# Patient Record
Sex: Male | Born: 1965 | Race: Black or African American | Hispanic: No | Marital: Married | Smoking: Never smoker
Health system: Southern US, Community
[De-identification: ages and names within clinical notes are randomized; demographics above are authoritative.]

## PROBLEM LIST (undated history)

## (undated) HISTORY — PX: VASECTOMY: SHX75

## (undated) HISTORY — PX: ORCHIECTOMY: SHX2116

---

## 2013-05-13 ENCOUNTER — Ambulatory Visit (INDEPENDENT_AMBULATORY_CARE_PROVIDER_SITE_OTHER): Payer: BC Managed Care – PPO

## 2013-05-13 ENCOUNTER — Other Ambulatory Visit: Payer: Self-pay | Admitting: Sports Medicine

## 2013-05-13 ENCOUNTER — Telehealth: Payer: Self-pay

## 2013-05-13 ENCOUNTER — Encounter: Payer: Self-pay | Admitting: Sports Medicine

## 2013-05-13 ENCOUNTER — Ambulatory Visit (INDEPENDENT_AMBULATORY_CARE_PROVIDER_SITE_OTHER): Payer: BC Managed Care – PPO | Admitting: Sports Medicine

## 2013-05-13 VITALS — BP 143/90 | HR 78 | Ht 71.0 in | Wt 259.0 lb

## 2013-05-13 DIAGNOSIS — N50812 Left testicular pain: Secondary | ICD-10-CM

## 2013-05-13 DIAGNOSIS — Z299 Encounter for prophylactic measures, unspecified: Secondary | ICD-10-CM | POA: Insufficient documentation

## 2013-05-13 DIAGNOSIS — Z9889 Other specified postprocedural states: Secondary | ICD-10-CM

## 2013-05-13 DIAGNOSIS — N509 Disorder of male genital organs, unspecified: Secondary | ICD-10-CM

## 2013-05-13 DIAGNOSIS — N433 Hydrocele, unspecified: Secondary | ICD-10-CM | POA: Insufficient documentation

## 2013-05-13 DIAGNOSIS — E669 Obesity, unspecified: Secondary | ICD-10-CM

## 2013-05-13 MED ORDER — NAPROXEN 500 MG PO TABS: 500.0000 mg | ORAL_TABLET | Freq: Two times a day (BID) | ORAL | Status: DC

## 2013-05-13 NOTE — Assessment & Plan Note (Signed)
Consider weight loss medication in the future.

## 2013-05-13 NOTE — Assessment & Plan Note (Signed)
We can discuss further at future visits.

## 2013-05-13 NOTE — Telephone Encounter (Signed)
Patient called wanted to know if he could get a pain medication sent to his pharmacy. Rhonda Cunningham,CMA

## 2013-05-13 NOTE — Assessment & Plan Note (Addendum)
With a history of a right testicular torsion with orchiectomy. Now, for 2 weeks he has had indolent pain in his remaining testicle. STAT ultrasound, UA, Urine GC/Chlam, Urine culture. If all is negative, we can workup further causes of his pain, if positive, he would need stat urologic consultation.  Testicular ultrasound is negative for torsion, there is a hydrocele which is likely causing his pain, which he does describe as worse with Valsalva. Referral to urology.

## 2013-05-13 NOTE — Addendum Note (Signed)
Addended by: Monica BectonHEKKEKANDAM, Forest Redwine J on: 05/13/2013 01:24 PM   Modules accepted: Orders

## 2013-05-13 NOTE — Progress Notes (Addendum)
  Subjective:    CC: Establish care.   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 His son is a 6 foot 3 inch 215 pounds tight end who seems to be an NFL prospect. Unfortunately in the past he had any episode of right testicular pain that resulted from a torsion, and subsequently led to orchiectomy. More recently, 2 weeks ago he developed pain in the left testicle, it feels similar to before. He denies any discharge, or dysuria. Pain is moderate and worsening. It does get worse with straining.  In the future he would also like to discuss health maintenance loss medication.  Past medical history, Surgical history, Family history not pertinant except as noted below, Social history, Allergies, and medications have been entered into the medical record, reviewed, and no changes needed.   Review of Systems: No headache, visual changes, nausea, vomiting, diarrhea, constipation, dizziness, abdominal pain, skin rash, fevers, chills, night sweats, swollen lymph nodes, weight loss, chest pain, body aches, joint swelling, muscle aches, shortness of breath, mood changes, visual or auditory hallucinations.  Objective:    General: Well Developed, well nourished, and in no acute distress.  Neuro: Alert and oriented x3, extra-ocular muscles intact, sensation grossly intact.  HEENT: Normocephalic, atraumatic, pupils equal round reactive to light, neck supple, no masses, no lymphadenopathy, thyroid nonpalpable.  Skin: Warm and dry, no rashes noted.  Cardiac: Regular rate and rhythm, no murmurs rubs or gallops.  Respiratory: Clear to auscultation bilaterally. Not using accessory muscles, speaking in full sentences.  Abdominal: Soft, nontender, nondistended, positive bowel sounds, no masses, no organomegaly.  Musculoskeletal: Shoulder, elbow, wrist, hip, knee, ankle stable, and with full range of motion. Genitourinary: The left testicle is somewhat high riding,  and feels to be horizontally position. It is extremely tender to palpation, particularly over the epididymis. No penile discharge, no lymphadenopathy, no hernias.  Impression and Recommendations:    The patient was counselled, risk factors were discussed, anticipatory guidance given.

## 2013-05-13 NOTE — Telephone Encounter (Signed)
Not a problem, calling in prescription strength naproxen, if insufficient, I can add tramadol.

## 2013-05-14 LAB — GC/CHLAMYDIA PROBE AMP, URINE
Chlamydia, Swab/Urine, PCR: NEGATIVE
GC Probe Amp, Urine: NEGATIVE

## 2013-05-14 LAB — URINALYSIS
Bilirubin Urine: NEGATIVE
Glucose, UA: NEGATIVE mg/dL
Hgb urine dipstick: NEGATIVE
Ketones, ur: NEGATIVE mg/dL
Leukocytes, UA: NEGATIVE
Nitrite: NEGATIVE
Protein, ur: NEGATIVE mg/dL
Specific Gravity, Urine: 1.023 (ref 1.005–1.030)
Urobilinogen, UA: 0.2 mg/dL (ref 0.0–1.0)
pH: 5.5 (ref 5.0–8.0)

## 2013-05-14 LAB — URINE CULTURE
Colony Count: NO GROWTH
Organism ID, Bacteria: NO GROWTH

## 2013-05-14 NOTE — Telephone Encounter (Signed)
Called patient left a detailed message on patient mvm advising that Naproxen was sent to pharmacy. Natausha Jungwirth,CMA

## 2013-05-15 ENCOUNTER — Telehealth: Payer: Self-pay

## 2013-05-15 DIAGNOSIS — Z299 Encounter for prophylactic measures, unspecified: Secondary | ICD-10-CM

## 2013-05-15 NOTE — Telephone Encounter (Signed)
Orders in 

## 2013-05-15 NOTE — Telephone Encounter (Signed)
Patient walked in request order for routine blood work. Patient will stop by tomorrow to have lab done. Tequlia Gonsalves,CMA

## 2013-05-17 ENCOUNTER — Telehealth: Payer: Self-pay | Admitting: Sports Medicine

## 2013-05-17 NOTE — Telephone Encounter (Signed)
Mr Paulla ForeKellum came by this morning. He was under the impression that he was being referred to [facility] for a Hernia but he was being referred for pain in left testicle, etc. He was given a copy of initial referral to call for an appt for the diagnoses noted.

## 2013-05-17 NOTE — Telephone Encounter (Signed)
Its a hydrocele which is causing his testicular pain, this is similar to a hernia. So yes he does need a urologist. Did we not set up the referral?

## 2013-05-17 NOTE — Telephone Encounter (Signed)
Dr T in answer to your question, referral was faxed  to [facility] on 5/11 but patient was told by a representative at  their office they  do not see Hernia patients. I called this  office today and left message for referral coordinator to get back with me on this.

## 2013-05-18 LAB — PSA, TOTAL AND FREE
PSA, Free Pct: 33 % (ref >25)
PSA, Free: 0.33 ng/mL
PSA: 1 ng/mL (ref <=4.00)

## 2013-05-18 LAB — CBC
HCT: 44.2 % (ref 39.0–52.0)
Hemoglobin: 15 g/dL (ref 13.0–17.0)
MCH: 29.8 pg (ref 26.0–34.0)
MCHC: 33.9 g/dL (ref 30.0–36.0)
MCV: 87.7 fL (ref 78.0–100.0)
Platelets: 217 10*3/uL (ref 150–400)
RBC: 5.04 MIL/uL (ref 4.22–5.81)
RDW: 13.9 % (ref 11.5–15.5)
WBC: 5.1 10*3/uL (ref 4.0–10.5)

## 2013-05-18 LAB — COMPREHENSIVE METABOLIC PANEL
ALT: 23 U/L (ref 0–53)
AST: 17 U/L (ref 0–37)
Albumin: 4.4 g/dL (ref 3.5–5.2)
BUN: 13 mg/dL (ref 6–23)
CO2: 24 mEq/L (ref 19–32)
Chloride: 104 mEq/L (ref 96–112)
Creat: 1.04 mg/dL (ref 0.50–1.35)
Sodium: 138 mEq/L (ref 135–145)
Total Bilirubin: 0.6 mg/dL (ref 0.2–1.2)
Total Protein: 7.2 g/dL (ref 6.0–8.3)

## 2013-05-18 LAB — COMPREHENSIVE METABOLIC PANEL WITH GFR
Alkaline Phosphatase: 66 U/L (ref 39–117)
Calcium: 9.5 mg/dL (ref 8.4–10.5)
Glucose, Bld: 96 mg/dL (ref 70–99)
Potassium: 4.3 meq/L (ref 3.5–5.3)

## 2013-05-18 LAB — LIPID PANEL
Cholesterol: 228 mg/dL — ABNORMAL HIGH (ref 0–200)
HDL: 36 mg/dL — ABNORMAL LOW (ref >39)
LDL Cholesterol: 162 mg/dL — ABNORMAL HIGH (ref 0–99)
Total CHOL/HDL Ratio: 6.3 Ratio
Triglycerides: 149 mg/dL (ref <150)
VLDL: 30 mg/dL (ref 0–40)

## 2013-05-18 LAB — HEMOGLOBIN A1C
Hgb A1c MFr Bld: 6.1 % — ABNORMAL HIGH (ref <5.7)
Mean Plasma Glucose: 128 mg/dL — ABNORMAL HIGH (ref <117)

## 2013-05-18 LAB — TSH: TSH: 1.194 u[IU]/mL (ref 0.350–4.500)

## 2013-05-20 ENCOUNTER — Encounter: Payer: Self-pay | Admitting: Sports Medicine

## 2013-05-20 ENCOUNTER — Ambulatory Visit (INDEPENDENT_AMBULATORY_CARE_PROVIDER_SITE_OTHER): Payer: BC Managed Care – PPO | Admitting: Sports Medicine

## 2013-05-20 VITALS — BP 138/80 | HR 91 | Ht 71.0 in | Wt 263.0 lb

## 2013-05-20 DIAGNOSIS — N433 Hydrocele, unspecified: Secondary | ICD-10-CM

## 2013-05-20 DIAGNOSIS — R7309 Other abnormal glucose: Secondary | ICD-10-CM

## 2013-05-20 DIAGNOSIS — E119 Type 2 diabetes mellitus without complications: Secondary | ICD-10-CM | POA: Insufficient documentation

## 2013-05-20 DIAGNOSIS — E785 Hyperlipidemia, unspecified: Secondary | ICD-10-CM

## 2013-05-20 DIAGNOSIS — E669 Obesity, unspecified: Secondary | ICD-10-CM

## 2013-05-20 DIAGNOSIS — R7303 Prediabetes: Secondary | ICD-10-CM

## 2013-05-20 LAB — TESTOSTERONE, FREE, TOTAL, SHBG
Sex Hormone Binding: 35 nmol/L (ref 13–71)
Testosterone, Free: 73.8 pg/mL (ref 47.0–244.0)
Testosterone-% Free: 1.9 % (ref 1.6–2.9)
Testosterone: 379 ng/dL (ref 300–890)

## 2013-05-20 MED ORDER — PHENTERMINE HCL 37.5 MG PO CAPS: 37.5000 mg | ORAL_CAPSULE | ORAL | Status: DC

## 2013-05-20 MED ORDER — ATORVASTATIN CALCIUM 40 MG PO TABS: 40.0000 mg | ORAL_TABLET | Freq: Every day | ORAL | Status: DC

## 2013-05-20 NOTE — Assessment & Plan Note (Signed)
Starting phentermine. Return monthly for weight checks and refills. 

## 2013-05-20 NOTE — Assessment & Plan Note (Signed)
Has not yet seen the urologist.

## 2013-05-20 NOTE — Progress Notes (Signed)
  Subjective:    CC: Followup and labs  HPI: Hyperlipidemia: Amenable to trying Lipitor.  Obesity: Desires to weight loss medication.  Hydrocele: Has not yet seen the urologist.  Past medical history, Surgical history, Family history not pertinant except as noted below, Social history, Allergies, and medications have been entered into the medical record, reviewed, and no changes needed.   Review of Systems: No fevers, chills, night sweats, weight loss, chest pain, or shortness of breath.   Objective:    General: Well Developed, well nourished, and in no acute distress.  Neuro: Alert and oriented x3, extra-ocular muscles intact, sensation grossly intact.  HEENT: Normocephalic, atraumatic, pupils equal round reactive to light, neck supple, no masses, no lymphadenopathy, thyroid nonpalpable.  Skin: Warm and dry, no rashes. Cardiac: Regular rate and rhythm, no murmurs rubs or gallops, no lower extremity edema.  Respiratory: Clear to auscultation bilaterally. Not using accessory muscles, speaking in full sentences.  Impression and Recommendations:

## 2013-05-20 NOTE — Assessment & Plan Note (Signed)
Starting Lipitor. Recheck in 3 months.

## 2013-05-20 NOTE — Assessment & Plan Note (Signed)
I think that weight loss combined with improved exercise and dieting will help decrease this towards the normal range. He recheck this in 3 months.

## 2013-06-17 ENCOUNTER — Encounter: Payer: Self-pay | Admitting: Sports Medicine

## 2013-06-17 ENCOUNTER — Ambulatory Visit (INDEPENDENT_AMBULATORY_CARE_PROVIDER_SITE_OTHER): Payer: BC Managed Care – PPO | Admitting: Sports Medicine

## 2013-06-17 VITALS — BP 133/86 | HR 106 | Ht 71.0 in | Wt 253.0 lb

## 2013-06-17 DIAGNOSIS — E669 Obesity, unspecified: Secondary | ICD-10-CM

## 2013-06-17 MED ORDER — PHENTERMINE HCL 37.5 MG PO CAPS: 37.5000 mg | ORAL_CAPSULE | ORAL | Status: DC

## 2013-06-17 MED ORDER — PHENTERMINE HCL 37.5 MG PO TABS: ORAL_TABLET | ORAL | Status: DC

## 2013-06-17 MED ORDER — TAMSULOSIN HCL 0.4 MG PO CAPS: 0.4000 mg | ORAL_CAPSULE | Freq: Every day | ORAL | Status: DC

## 2013-06-17 NOTE — Progress Notes (Signed)
  Subjective:    CC: Follow up  HPI: Obesity: 10 pound weight loss since starting phentermine, unfortunately having some obstructive uropathy type symptoms and a few palpitations. No chest pain, shortness of breath, presyncope.  Past medical history, Surgical history, Family history not pertinant except as noted below, Social history, Allergies, and medications have been entered into the medical record, reviewed, and no changes needed.   Review of Systems: No fevers, chills, night sweats, weight loss, chest pain, or shortness of breath.   Objective:    General: Well Developed, well nourished, and in no acute distress.  Neuro: Alert and oriented x3, extra-ocular muscles intact, sensation grossly intact.  HEENT: Normocephalic, atraumatic, pupils equal round reactive to light, neck supple, no masses, no lymphadenopathy, thyroid nonpalpable.  Skin: Warm and dry, no rashes. Cardiac: Regular rate and rhythm, no murmurs rubs or gallops, no lower extremity edema.  Respiratory: Clear to auscultation bilaterally. Not using accessory muscles, speaking in full sentences.   Impression and Recommendations:

## 2013-06-17 NOTE — Assessment & Plan Note (Addendum)
Excellent response with a 10 pound weight loss in the first month. Refilling medication. He does seem to be getting some obstructive uropathy, we discussed breaking to half a tab twice a day and adding an additional Flomax. At this point we will add Flomax as this may be somewhat helpful for his blood pressure as well. Return in a month.

## 2013-07-15 ENCOUNTER — Ambulatory Visit (INDEPENDENT_AMBULATORY_CARE_PROVIDER_SITE_OTHER): Payer: BC Managed Care – PPO | Admitting: Sports Medicine

## 2013-07-15 ENCOUNTER — Encounter: Payer: Self-pay | Admitting: Sports Medicine

## 2013-07-15 VITALS — BP 122/81 | HR 116 | Ht 71.0 in | Wt 251.0 lb

## 2013-07-15 DIAGNOSIS — J45909 Unspecified asthma, uncomplicated: Secondary | ICD-10-CM

## 2013-07-15 DIAGNOSIS — J4520 Mild intermittent asthma, uncomplicated: Secondary | ICD-10-CM

## 2013-07-15 DIAGNOSIS — E669 Obesity, unspecified: Secondary | ICD-10-CM

## 2013-07-15 DIAGNOSIS — N139 Obstructive and reflux uropathy, unspecified: Secondary | ICD-10-CM

## 2013-07-15 MED ORDER — PHENTERMINE HCL 37.5 MG PO CAPS: 37.5000 mg | ORAL_CAPSULE | ORAL | Status: DC

## 2013-07-15 MED ORDER — ALBUTEROL SULFATE HFA 108 (90 BASE) MCG/ACT IN AERS: 2.0000 | INHALATION_SPRAY | Freq: Four times a day (QID) | RESPIRATORY_TRACT | Status: AC | PRN

## 2013-07-15 NOTE — Assessment & Plan Note (Signed)
Well controlled with Flomax 

## 2013-07-15 NOTE — Assessment & Plan Note (Signed)
Continue antihistamines, refilling albuterol.

## 2013-07-15 NOTE — Assessment & Plan Note (Signed)
Refilling phentermine. Return in 1 month.

## 2013-07-15 NOTE — Progress Notes (Signed)
  Subjective:    CC: Followup  HPI: Obesity: Has lost a little bit more weight.  Obstructive uropathy : completely resolved with Flomax.  Allergic asthma: Needs refill on albuterol.  Past medical history, Surgical history, Family history not pertinant except as noted below, Social history, Allergies, and medications have been entered into the medical record, reviewed, and no changes needed.   Review of Systems: No fevers, chills, night sweats, weight loss, chest pain, or shortness of breath.   Objective:    General: Well Developed, well nourished, and in no acute distress.  Neuro: Alert and oriented x3, extra-ocular muscles intact, sensation grossly intact.  HEENT: Normocephalic, atraumatic, pupils equal round reactive to light, neck supple, no masses, no lymphadenopathy, thyroid nonpalpable.  Skin: Warm and dry, no rashes. Cardiac: Regular rate and rhythm, no murmurs rubs or gallops, no lower extremity edema.  Respiratory: Clear to auscultation bilaterally. Not using accessory muscles, speaking in full sentences.  Impression and Recommendations:

## 2013-07-21 ENCOUNTER — Encounter: Payer: Self-pay | Admitting: Sports Medicine

## 2013-07-21 DIAGNOSIS — E669 Obesity, unspecified: Secondary | ICD-10-CM

## 2013-07-22 MED ORDER — VALACYCLOVIR HCL 1 G PO TABS: 1000.0000 mg | ORAL_TABLET | Freq: Two times a day (BID) | ORAL | Status: DC

## 2013-07-25 MED ORDER — VALACYCLOVIR HCL 1 G PO TABS: 1000.0000 mg | ORAL_TABLET | Freq: Two times a day (BID) | ORAL | Status: AC

## 2013-07-25 MED ORDER — PHENTERMINE HCL 37.5 MG PO CAPS: 37.5000 mg | ORAL_CAPSULE | ORAL | Status: DC

## 2013-07-25 NOTE — Addendum Note (Signed)
Addended by: Monica BectonHEKKEKANDAM, Teretha Chalupa J on: 07/25/2013 05:04 PM   Modules accepted: Orders

## 2013-08-06 ENCOUNTER — Encounter: Payer: Self-pay | Admitting: Sports Medicine

## 2013-08-06 MED ORDER — TADALAFIL 5 MG PO TABS: 5.0000 mg | ORAL_TABLET | Freq: Every day | ORAL | Status: DC | PRN

## 2013-08-12 ENCOUNTER — Ambulatory Visit: Payer: BC Managed Care – PPO | Admitting: Sports Medicine

## 2013-08-13 ENCOUNTER — Encounter: Payer: Self-pay | Admitting: Sports Medicine

## 2013-08-13 ENCOUNTER — Ambulatory Visit (INDEPENDENT_AMBULATORY_CARE_PROVIDER_SITE_OTHER): Payer: BC Managed Care – PPO | Admitting: Sports Medicine

## 2013-08-13 VITALS — BP 136/84 | HR 89 | Ht 71.0 in | Wt 249.0 lb

## 2013-08-13 DIAGNOSIS — N139 Obstructive and reflux uropathy, unspecified: Secondary | ICD-10-CM | POA: Diagnosis not present

## 2013-08-13 DIAGNOSIS — B360 Pityriasis versicolor: Secondary | ICD-10-CM | POA: Diagnosis not present

## 2013-08-13 DIAGNOSIS — E785 Hyperlipidemia, unspecified: Secondary | ICD-10-CM

## 2013-08-13 DIAGNOSIS — Z23 Encounter for immunization: Secondary | ICD-10-CM

## 2013-08-13 DIAGNOSIS — R7303 Prediabetes: Secondary | ICD-10-CM

## 2013-08-13 DIAGNOSIS — R7309 Other abnormal glucose: Secondary | ICD-10-CM

## 2013-08-13 DIAGNOSIS — E669 Obesity, unspecified: Secondary | ICD-10-CM

## 2013-08-13 MED ORDER — FLUCONAZOLE 150 MG PO TABS: ORAL_TABLET | ORAL | Status: DC

## 2013-08-13 MED ORDER — PHENTERMINE HCL 37.5 MG PO CAPS: 37.5000 mg | ORAL_CAPSULE | ORAL | Status: DC

## 2013-08-13 NOTE — Assessment & Plan Note (Signed)
Continue Lipitor, rechecking lipids per patient request.

## 2013-08-13 NOTE — Assessment & Plan Note (Signed)
Symptoms resolve with Flomax.

## 2013-08-13 NOTE — Progress Notes (Signed)
  Subjective:    CC: Recheck weight  HPI: Richard Rios is a pleasant 48 year old male, he has lost another couple of pounds.  Erectile dysfunction: Has not yet gotten the Cialis.  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  Hyperlipidemia: Stable on Lipitor.  Past medical history, Surgical history, Family history not pertinant except as noted below, Social history, Allergies, and medications have been entered into the medical record, reviewed, and no changes needed.   Review of Systems: No fevers, chills, night sweats, weight loss, chest pain, or shortness of breath.   Objective:    General: Well Developed, well nourished, and in no acute distress.  Neuro: Alert and oriented x3, extra-ocular muscles intact, sensation grossly intact.  HEENT: Normocephalic, atraumatic, pupils equal round reactive to light, neck supple, no masses, no lymphadenopathy, thyroid nonpalpable.  Skin: Warm and dry, there is a splotchy hypopigmented rash on his arms and back nonpruritic. Cardiac: Regular rate and rhythm, no murmurs rubs or gallops, no lower extremity edema.  Respiratory: Clear to auscultation bilaterally. Not using accessory muscles, speaking in full sentences.  Impression and Recommendations:

## 2013-08-13 NOTE — Assessment & Plan Note (Signed)
Fluconazole 300 mg Q. weekly x2

## 2013-08-13 NOTE — Assessment & Plan Note (Signed)
Continued weight loss. Refilling phentermine.

## 2013-08-13 NOTE — Assessment & Plan Note (Signed)
Check hemoglobin A1c.

## 2013-09-13 LAB — LIPID PANEL
Cholesterol: 215 mg/dL — ABNORMAL HIGH (ref 0–200)
HDL: 39 mg/dL — ABNORMAL LOW (ref >39)
LDL Cholesterol: 155 mg/dL — ABNORMAL HIGH (ref 0–99)
Total CHOL/HDL Ratio: 5.5 Ratio
Triglycerides: 103 mg/dL (ref <150)
VLDL: 21 mg/dL (ref 0–40)

## 2013-09-13 LAB — COMPREHENSIVE METABOLIC PANEL
AST: 16 U/L (ref 0–37)
BUN: 18 mg/dL (ref 6–23)
CO2: 27 mEq/L (ref 19–32)
Calcium: 9.8 mg/dL (ref 8.4–10.5)
Chloride: 99 mEq/L (ref 96–112)
Creat: 1.13 mg/dL (ref 0.50–1.35)
Potassium: 4.5 mEq/L (ref 3.5–5.3)
Sodium: 137 mEq/L (ref 135–145)
Total Protein: 7.4 g/dL (ref 6.0–8.3)

## 2013-09-13 LAB — COMPREHENSIVE METABOLIC PANEL WITH GFR
ALT: 24 U/L (ref 0–53)
Albumin: 4.5 g/dL (ref 3.5–5.2)
Alkaline Phosphatase: 80 U/L (ref 39–117)
Glucose, Bld: 97 mg/dL (ref 70–99)
Total Bilirubin: 0.9 mg/dL (ref 0.2–1.2)

## 2013-09-13 LAB — HEMOGLOBIN A1C
Hgb A1c MFr Bld: 6.5 % — ABNORMAL HIGH (ref <5.7)
Mean Plasma Glucose: 140 mg/dL — ABNORMAL HIGH (ref <117)

## 2013-09-18 ENCOUNTER — Encounter: Payer: Self-pay | Admitting: Sports Medicine

## 2013-09-18 ENCOUNTER — Ambulatory Visit (INDEPENDENT_AMBULATORY_CARE_PROVIDER_SITE_OTHER): Payer: BC Managed Care – PPO | Admitting: Sports Medicine

## 2013-09-18 VITALS — BP 139/83 | HR 94 | Ht 71.0 in | Wt 244.0 lb

## 2013-09-18 DIAGNOSIS — E669 Obesity, unspecified: Secondary | ICD-10-CM | POA: Diagnosis not present

## 2013-09-18 DIAGNOSIS — B360 Pityriasis versicolor: Secondary | ICD-10-CM | POA: Diagnosis not present

## 2013-09-18 DIAGNOSIS — Z23 Encounter for immunization: Secondary | ICD-10-CM

## 2013-09-18 MED ORDER — PHENTERMINE HCL 37.5 MG PO CAPS: ORAL_CAPSULE | ORAL | Status: DC

## 2013-09-18 NOTE — Assessment & Plan Note (Signed)
Has not yet started treatment for tinea versicolor. We can recheck this in one month when he comes back for his weight check.

## 2013-09-18 NOTE — Progress Notes (Signed)
  Subjective:    CC: Wt check  HPI: Obesity: Excellent continued weight loss, 20 pounds so far in 4 months.  Tinea versicolor: Has not yet started Diflucan.  Past medical history, Surgical history, Family history not pertinant except as noted below, Social history, Allergies, and medications have been entered into the medical record, reviewed, and no changes needed.   Review of Systems: No fevers, chills, night sweats, weight loss, chest pain, or shortness of breath.   Objective:    General: Well Developed, well nourished, and in no acute distress.  Neuro: Alert and oriented x3, extra-ocular muscles intact, sensation grossly intact.  HEENT: Normocephalic, atraumatic, pupils equal round reactive to light, neck supple, no masses, no lymphadenopathy, thyroid nonpalpable.  Skin: Warm and dry, there is a splotchy hypopigmented rash on his arms and back nonpruritic Cardiac: Regular rate and rhythm, no murmurs rubs or gallops, no lower extremity edema.  Respiratory: Clear to auscultation bilaterally. Not using accessory muscles, speaking in full sentences.  Impression and Recommendations:

## 2013-09-18 NOTE — Assessment & Plan Note (Signed)
Continue phentermine. A1c will likely come down as he continues to lose weight. He has now lost 20 pounds since May of this year.

## 2013-09-21 ENCOUNTER — Other Ambulatory Visit: Payer: Self-pay | Admitting: Sports Medicine

## 2013-09-23 MED ORDER — TADALAFIL 5 MG PO TABS: 5.0000 mg | ORAL_TABLET | Freq: Every day | ORAL | Status: DC | PRN

## 2013-10-11 ENCOUNTER — Telehealth: Payer: Self-pay | Admitting: *Deleted

## 2013-10-11 NOTE — Telephone Encounter (Signed)
Pharmacy was called that his insurance denied cialis and they are going to contract the patient. Corliss SkainsJamie Camari Wisham, CMA

## 2013-10-18 ENCOUNTER — Encounter: Payer: Self-pay | Admitting: Sports Medicine

## 2013-10-18 ENCOUNTER — Ambulatory Visit (INDEPENDENT_AMBULATORY_CARE_PROVIDER_SITE_OTHER): Payer: BC Managed Care – PPO | Admitting: Sports Medicine

## 2013-10-18 VITALS — BP 127/73 | HR 80 | Ht 71.0 in | Wt 243.0 lb

## 2013-10-18 DIAGNOSIS — B360 Pityriasis versicolor: Secondary | ICD-10-CM | POA: Diagnosis not present

## 2013-10-18 DIAGNOSIS — N139 Obstructive and reflux uropathy, unspecified: Secondary | ICD-10-CM

## 2013-10-18 DIAGNOSIS — E669 Obesity, unspecified: Secondary | ICD-10-CM | POA: Diagnosis not present

## 2013-10-18 MED ORDER — PHENTERMINE HCL 37.5 MG PO CAPS: ORAL_CAPSULE | ORAL | Status: DC

## 2013-10-18 MED ORDER — FLUCONAZOLE 150 MG PO TABS: ORAL_TABLET | ORAL | Status: DC

## 2013-10-18 NOTE — Assessment & Plan Note (Signed)
Improve significantly on Diflucan. Adding an additional course.

## 2013-10-18 NOTE — Assessment & Plan Note (Signed)
Doing very well with Flomax however has started to experience some orthostasis. He is going to discontinue Flomax for now, if obstructive symptoms persist we can certainly consider RapaFlo.

## 2013-10-18 NOTE — Assessment & Plan Note (Signed)
Continues to lose weight, over 20 pounds so far since May. We will refill phentermine, for one more month, and then switch to one half dose

## 2013-10-18 NOTE — Progress Notes (Signed)
  Subjective:    CC: Followup  HPI: Obesity: Continued weight loss, although slowing.  Tinea versicolor: Improved with Diflucan, although still present.   Obstructive uropathy: Improved with Flomax, however is starting to get some orthostatic symptoms.   Past medical history, Surgical history, Family history not pertinant except as noted below, Social history, Allergies, and medications have been entered into the medical record, reviewed, and no changes needed.   Review of Systems: No fevers, chills, night sweats, weight loss, chest pain, or shortness of breath.   Objective:    General: Well Developed, well nourished, and in no acute distress.  Neuro: Alert and oriented x3, extra-ocular muscles intact, sensation grossly intact.  HEENT: Normocephalic, atraumatic, pupils equal round reactive to light, neck supple, no masses, no lymphadenopathy, thyroid nonpalpable.  Skin: Warm and dry,  persistent but less evident hyperpigmented rash over the arms and back.  Cardiac: Regular rate and rhythm, no murmurs rubs or gallops, no lower extremity edema.  Respiratory: Clear to auscultation bilaterally. Not using accessory muscles, speaking in full sentences.  Impression and Recommendations:

## 2013-10-18 NOTE — Patient Instructions (Signed)
Tamsulosin capsules   What is this medicine?   TAMSULOSIN (tam SOO loe sin) is used to treat enlargement of the prostate gland in men, a condition called benign prostatic hyperplasia or BPH. It is not for use in women. It works by relaxing muscles in the prostate and bladder neck. This improves urine flow and reduces BPH symptoms.   This medicine may be used for other purposes; ask your health care provider or pharmacist if you have questions.   COMMON BRAND NAME(S): Flomax   What should I tell my health care provider before I take this medicine?   They need to know if you have any of the following conditions:   -advanced kidney disease   -advanced liver disease   -low blood pressure   -prostate cancer   -an unusual or allergic reaction to tamsulosin, sulfa drugs, other medicines, foods, dyes, or preservatives   -pregnant or trying to get pregnant   -breast-feeding   How should I use this medicine?   Take this medicine by mouth about 30 minutes after the same meal every day. Follow the directions on the prescription label. Swallow the capsules whole with a glass of water. Do not crush, chew, or open capsules. Do not take your medicine more often than directed. Do not stop taking your medicine unless your doctor tells you to.   Talk to your pediatrician regarding the use of this medicine in children. Special care may be needed.   Overdosage: If you think you have taken too much of this medicine contact a poison control center or emergency room at once.   NOTE: This medicine is only for you. Do not share this medicine with others.   What if I miss a dose?   If you miss a dose, take it as soon as you can. If it is almost time for your next dose, take only that dose. Do not take double or extra doses. If you stop taking your medicine for several days or more, ask your doctor or health care professional what dose you should start back on.   What may interact with this medicine?   -cimetidine   -fluoxetine   -ketoconazole    -medicines for erectile disfunction like sildenafil, tadalafil, vardenafil   -medicines for high blood pressure   -other alpha-blockers like alfuzosin, doxazosin, phentolamine, phenoxybenzamine, prazosin, terazosin   -warfarin   This list may not describe all possible interactions. Give your health care provider a list of all the medicines, herbs, non-prescription drugs, or dietary supplements you use. Also tell them if you smoke, drink alcohol, or use illegal drugs. Some items may interact with your medicine.   What should I watch for while using this medicine?   Visit your doctor or health care professional for regular check ups. You will need lab work done before you start this medicine and regularly while you are taking it. Check your blood pressure as directed. Ask your health care professional what your blood pressure should be, and when you should contact him or her.   This medicine may make you feel dizzy or lightheaded. This is more likely to happen after the first dose, after an increase in dose, or during hot weather or exercise. Drinking alcohol and taking some medicines can make this worse. Do not drive, use machinery, or do anything that needs mental alertness until you know how this medicine affects you. Do not sit or stand up quickly. If you begin to feel dizzy, sit down until you   feel better. These effects can decrease once your body adjusts to the medicine.   Contact your doctor or health care professional right away if you have an erection that lasts longer than 4 hours or if it becomes painful. This may be a sign of a serious problem and must be treated right away to prevent permanent damage.   If you are thinking of having cataract surgery, tell your eye surgeon that you have taken this medicine.   What side effects may I notice from receiving this medicine?   Side effects that you should report to your doctor or health care professional as soon as possible:   -allergic reactions like skin  rash or itching, hives, swelling of the lips, mouth, tongue, or throat   -breathing problems   -change in vision   -feeling faint or lightheaded   -irregular heartbeat   -prolonged or painful erection   -weakness   Side effects that usually do not require medical attention (report to your doctor or health care professional if they continue or are bothersome):   -back pain   -change in sex drive or performance   -constipation, nausea or vomiting   -cough   -drowsy   -runny or stuffy nose   -trouble sleeping   This list may not describe all possible side effects. Call your doctor for medical advice about side effects. You may report side effects to FDA at 1-800-FDA-1088.   Where should I keep my medicine?   Keep out of the reach of children.   Store at room temperature between 15 and 30 degrees C (59 and 86 degrees F). Throw away any unused medicine after the expiration date.   NOTE: This sheet is a summary. It may not cover all possible information. If you have questions about this medicine, talk to your doctor, pharmacist, or health care provider.    2015, [employer]. (2011-12-21 14:11:34)

## 2013-11-01 ENCOUNTER — Encounter: Payer: Self-pay | Admitting: Sports Medicine

## 2013-11-04 NOTE — Telephone Encounter (Signed)
Patient needs help with getting MRI and imaging test results/reports to me.  Lets make sure I get images on a disc.

## 2013-11-15 ENCOUNTER — Ambulatory Visit (INDEPENDENT_AMBULATORY_CARE_PROVIDER_SITE_OTHER): Payer: BC Managed Care – PPO | Admitting: Sports Medicine

## 2013-11-15 ENCOUNTER — Ambulatory Visit (INDEPENDENT_AMBULATORY_CARE_PROVIDER_SITE_OTHER): Payer: BC Managed Care – PPO

## 2013-11-15 ENCOUNTER — Telehealth: Payer: Self-pay | Admitting: Sports Medicine

## 2013-11-15 ENCOUNTER — Encounter: Payer: Self-pay | Admitting: Sports Medicine

## 2013-11-15 VITALS — BP 139/89 | HR 99 | Ht 71.0 in | Wt 242.0 lb

## 2013-11-15 DIAGNOSIS — E669 Obesity, unspecified: Secondary | ICD-10-CM | POA: Diagnosis not present

## 2013-11-15 DIAGNOSIS — B360 Pityriasis versicolor: Secondary | ICD-10-CM | POA: Diagnosis not present

## 2013-11-15 DIAGNOSIS — M5412 Radiculopathy, cervical region: Secondary | ICD-10-CM | POA: Insufficient documentation

## 2013-11-15 DIAGNOSIS — M5416 Radiculopathy, lumbar region: Secondary | ICD-10-CM

## 2013-11-15 DIAGNOSIS — M545 Low back pain: Secondary | ICD-10-CM

## 2013-11-15 DIAGNOSIS — M4802 Spinal stenosis, cervical region: Secondary | ICD-10-CM

## 2013-11-15 MED ORDER — FLUCONAZOLE 150 MG PO TABS: ORAL_TABLET | ORAL | Status: DC

## 2013-11-15 MED ORDER — PHENTERMINE HCL 37.5 MG PO CAPS: ORAL_CAPSULE | ORAL | Status: DC

## 2013-11-15 MED ORDER — PREDNISONE 50 MG PO TABS
ORAL_TABLET | ORAL | Status: DC
Start: 2013-11-15 — End: 2014-02-20

## 2013-11-15 NOTE — Assessment & Plan Note (Signed)
Refilling phentermine. 

## 2013-11-15 NOTE — Assessment & Plan Note (Signed)
With a positive Lhermitte sign. X-rays, MRI of cervical spine. Prednisone burst.

## 2013-11-15 NOTE — Telephone Encounter (Signed)
Called [employer] @ [phone] and spoke with Octaviano Glowya to get pre authorization for MRI Cervical Spine and Lumbar Spine WO Contrast.  Received approval for MRI Cervical and Lumbar Spine, Authorization # [id number]. WB

## 2013-11-15 NOTE — Assessment & Plan Note (Signed)
Did not fill Diflucan, refilling.

## 2013-11-15 NOTE — Assessment & Plan Note (Signed)
With right-sided L5 versus S1 radiculopathy. Prednisone, x-rays, MRI. Return to go over imaging results, we will plan for an intervention.

## 2013-11-15 NOTE — Progress Notes (Signed)
  Subjective:    CC: follow-up  HPI: Tinea versicolor: Lost his prescription for Diflucan.  Phentermine: 1 pound weight loss, desires refill.  Right leg pain: History of sciatica, pain with extension of the knee and flexion of the hip on the right. No bowel or bladder dysfunction or saddle numbness, no trauma.  Neck pain: Radiates down the entire back and leg with extension of the cervical spine. No bowel or bladder dysfunction. No constitutional symptoms.  Past medical history, Surgical history, Family history not pertinant except as noted below, Social history, Allergies, and medications have been entered into the medical record, reviewed, and no changes needed.   Review of Systems: No fevers, chills, night sweats, weight loss, chest pain, or shortness of breath.   Objective:    General: Well Developed, well nourished, and in no acute distress.  Neuro: Alert and oriented x3, extra-ocular muscles intact, sensation grossly intact.  HEENT: Normocephalic, atraumatic, pupils equal round reactive to light, neck supple, no masses, no lymphadenopathy, thyroid nonpalpable.  Skin: Warm and dry, no rashes. Cardiac: Regular rate and rhythm, no murmurs rubs or gallops, no lower extremity edema.  Respiratory: Clear to auscultation bilaterally. Not using accessory muscles, speaking in full sentences. Neck: Negative spurling's Full neck range of motion Grip strength and sensation normal in bilateral hands Strength good C4 to T1 distribution No sensory change to C4 to T1 Reflexes normal Back Exam:  Inspection: Unremarkable  Motion: Flexion 45 deg, Extension 45 deg, Side Bending to 45 deg bilaterally,  Rotation to 45 deg bilaterally  SLR laying: positive on the right with reproduction of L5 and S1 radiculitis. XSLR laying: Negative  Palpable tenderness: None. FABER: negative. Sensory change: Gross sensation intact to all lumbar and sacral dermatomes.  Reflexes: 2+ at both patellar tendons,  2+ at achilles tendons, Babinski's downgoing.  Strength at foot  Plantar-flexion: 5/5 Dorsi-flexion: 5/5 Eversion: 5/5 Inversion: 5/5  Leg strength  Quad: 5/5 Hamstring: 5/5 Hip flexor: 5/5 Hip abductors: 5/5  Gait unremarkable.  Impression and Recommendations:

## 2013-11-25 ENCOUNTER — Ambulatory Visit (INDEPENDENT_AMBULATORY_CARE_PROVIDER_SITE_OTHER): Payer: BC Managed Care – PPO

## 2013-11-25 DIAGNOSIS — M5416 Radiculopathy, lumbar region: Secondary | ICD-10-CM

## 2013-11-25 DIAGNOSIS — M4802 Spinal stenosis, cervical region: Secondary | ICD-10-CM

## 2013-11-25 DIAGNOSIS — M4806 Spinal stenosis, lumbar region: Secondary | ICD-10-CM

## 2013-11-25 DIAGNOSIS — M5082 Other cervical disc disorders, mid-cervical region: Secondary | ICD-10-CM

## 2013-11-26 ENCOUNTER — Encounter: Payer: Self-pay | Admitting: Sports Medicine

## 2013-11-26 ENCOUNTER — Ambulatory Visit (INDEPENDENT_AMBULATORY_CARE_PROVIDER_SITE_OTHER): Payer: BC Managed Care – PPO | Admitting: Sports Medicine

## 2013-11-26 DIAGNOSIS — M5416 Radiculopathy, lumbar region: Secondary | ICD-10-CM

## 2013-11-26 DIAGNOSIS — M4802 Spinal stenosis, cervical region: Secondary | ICD-10-CM

## 2013-11-26 NOTE — Progress Notes (Signed)
  Subjective:    CC: MRI results  HPI: This is a pleasant 48 year old male, he comes in with right-sided cervical radicular symptoms, worse with extension and right-sided rotation of his neck. MRI results will be dictated below.  He also has right-sided S1 lumbar radiculopathy with pain down the buttock, posterior thigh, posterior calf, to the heel. MRI results will be dictated below. Symptoms are moderate, persistent.  Past medical history, Surgical history, Family history not pertinant except as noted below, Social history, Allergies, and medications have been entered into the medical record, reviewed, and no changes needed.   Review of Systems: No fevers, chills, night sweats, weight loss, chest pain, or shortness of breath.   Objective:    General: Well Developed, well nourished, and in no acute distress.  Neuro: Alert and oriented x3, extra-ocular muscles intact, sensation grossly intact.  HEENT: Normocephalic, atraumatic, pupils equal round reactive to light, neck supple, no masses, no lymphadenopathy, thyroid nonpalpable.  Skin: Warm and dry, no rashes. Cardiac: Regular rate and rhythm, no murmurs rubs or gallops, no lower extremity edema.  Respiratory: Clear to auscultation bilaterally. Not using accessory muscles, speaking in full sentences.  Cervical spine MRI shows right-sided C6-C7 uncovertebral hypertrophic causing severe right foraminal stenosis.  Lumbar spine MRI shows a midline L5-S1 disc protrusion.  Impression and Recommendations:

## 2013-11-26 NOTE — Assessment & Plan Note (Signed)
Now clinically represents right-sided S1 nerve root. There is a central L5-S1 disc protrusion. We are going to proceed with a right-sided selective S1 nerve root block. Return 2-3 weeks after the injection to evaluate response.

## 2013-11-26 NOTE — Assessment & Plan Note (Signed)
Persistent right-sided pain. We are going to proceed with a right-sided C6-C7 interlaminar epidural. Return to see me 2-3 weeks after the injection to evaluate response.

## 2013-12-09 ENCOUNTER — Ambulatory Visit
Admission: RE | Admit: 2013-12-09 | Discharge: 2013-12-09 | Disposition: A | Discharge status: Home / Self Care | Payer: BC Managed Care – PPO | Source: Ambulatory Visit | Attending: Sports Medicine | Admitting: Sports Medicine

## 2013-12-09 DIAGNOSIS — M5416 Radiculopathy, lumbar region: Secondary | ICD-10-CM

## 2013-12-09 MED ORDER — METHYLPREDNISOLONE ACETATE 40 MG/ML INJ SUSP (RADIOLOG
120.0000 mg | Freq: Once | INTRAMUSCULAR | Status: AC
  Administered 2013-12-09: 120 mg via EPIDURAL

## 2013-12-09 MED ORDER — IOHEXOL 180 MG/ML  SOLN
1.0000 mL | Freq: Once | INTRAMUSCULAR | Status: AC | PRN
  Administered 2013-12-09: 1 mL via EPIDURAL

## 2013-12-09 NOTE — Discharge Instructions (Signed)

## 2014-01-04 ENCOUNTER — Encounter: Payer: Self-pay | Admitting: Sports Medicine

## 2014-01-06 MED ORDER — TADALAFIL 5 MG PO TABS: 5.0000 mg | ORAL_TABLET | Freq: Every day | ORAL | Status: DC | PRN

## 2014-01-08 ENCOUNTER — Telehealth: Payer: Self-pay | Admitting: *Deleted

## 2014-01-08 NOTE — Telephone Encounter (Signed)
I have initiated the prior auth twice for cialis but it will not process due to prior denial in the fall. I think that I can get it to process if we change the amount to 30 per 30 like it would be for BPH instead. Please advise.

## 2014-01-09 NOTE — Telephone Encounter (Signed)
Yes, let's try that.

## 2014-01-10 NOTE — Telephone Encounter (Signed)
Thank you :)

## 2014-01-10 NOTE — Telephone Encounter (Signed)
I tried to submit this again and it still denied. I will try and call them instead of submitting through cover my meds.

## 2014-01-20 ENCOUNTER — Encounter: Payer: Self-pay | Admitting: Sports Medicine

## 2014-01-20 DIAGNOSIS — M5416 Radiculopathy, lumbar region: Secondary | ICD-10-CM

## 2014-01-20 NOTE — Telephone Encounter (Signed)
Good response, 1.5 months to right-sided selective S1 epidural, I am going to order an additional 1, let's see if he can get this scheduled on the same day as his cervical spine epidural.

## 2014-01-23 ENCOUNTER — Encounter: Payer: Self-pay | Admitting: Sports Medicine

## 2014-01-23 ENCOUNTER — Telehealth: Payer: Self-pay

## 2014-01-23 MED ORDER — TRAMADOL HCL 50 MG PO TABS: ORAL_TABLET | ORAL | Status: DC

## 2014-01-23 NOTE — Telephone Encounter (Signed)
Patient called stated that he cant get in to get his injection done for his back until late next week he is requesting a pain medication sent to his pharmacy. Rhonda Cunningham,CMA

## 2014-01-23 NOTE — Telephone Encounter (Signed)
Tramadol has been faxed to [facility]. Rhonda Cunningham,CMA  

## 2014-01-23 NOTE — Telephone Encounter (Signed)
Tramadol in box.

## 2014-01-24 ENCOUNTER — Other Ambulatory Visit: Payer: Self-pay

## 2014-01-28 ENCOUNTER — Ambulatory Visit
Admission: RE | Admit: 2014-01-28 | Discharge: 2014-01-28 | Disposition: A | Discharge status: Home / Self Care | Payer: BC Managed Care – PPO | Source: Ambulatory Visit | Attending: Sports Medicine | Admitting: Sports Medicine

## 2014-01-28 DIAGNOSIS — M5416 Radiculopathy, lumbar region: Secondary | ICD-10-CM

## 2014-01-28 MED ORDER — IOHEXOL 180 MG/ML  SOLN
1.0000 mL | Freq: Once | INTRAMUSCULAR | Status: AC | PRN
  Administered 2014-01-28: 1 mL via EPIDURAL

## 2014-01-28 MED ORDER — METHYLPREDNISOLONE ACETATE 40 MG/ML INJ SUSP (RADIOLOG
120.0000 mg | Freq: Once | INTRAMUSCULAR | Status: AC
Start: 2014-01-28 — End: 2014-01-28
  Administered 2014-01-28: 120 mg via EPIDURAL

## 2014-02-20 ENCOUNTER — Ambulatory Visit (INDEPENDENT_AMBULATORY_CARE_PROVIDER_SITE_OTHER): Payer: BC Managed Care – PPO | Admitting: Sports Medicine

## 2014-02-20 ENCOUNTER — Encounter: Payer: Self-pay | Admitting: Sports Medicine

## 2014-02-20 VITALS — BP 148/89 | HR 87 | Ht 71.0 in | Wt 253.0 lb

## 2014-02-20 DIAGNOSIS — M5416 Radiculopathy, lumbar region: Secondary | ICD-10-CM

## 2014-02-20 DIAGNOSIS — M4802 Spinal stenosis, cervical region: Secondary | ICD-10-CM

## 2014-02-20 MED ORDER — GABAPENTIN 300 MG PO CAPS: ORAL_CAPSULE | ORAL | Status: DC

## 2014-02-20 NOTE — Progress Notes (Signed)
  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 He has had 2 selective S1 nerve root blocks, the first of which provided proximally 45 days of complete pain relief, the second which provided no response. Currently his pain no longer goes all the way down to his foot, and he denies any axial pain, all radicular.  Cervical radiculopathy: Resolved.  Past medical history, Surgical history, Family history not pertinant except as noted below, Social history, Allergies, and medications have been entered into the medical record, reviewed, and no changes needed.   Review of Systems: No fevers, chills, night sweats, weight loss, chest pain, or shortness of breath.   Objective:    General: Well Developed, well nourished, and in no acute distress.  Neuro: Alert and oriented x3, extra-ocular muscles intact, sensation grossly intact.  HEENT: Normocephalic, atraumatic, pupils equal round reactive to light, neck supple, no masses, no lymphadenopathy, thyroid nonpalpable.  Skin: Warm and dry, no rashes. Cardiac: Regular rate and rhythm, no murmurs rubs or gallops, no lower extremity edema.  Respiratory: Clear to auscultation bilaterally. Not using accessory muscles, speaking in full sentences.  I again reviewed his lumbar spine MRI which shows a very small non-desiccated disc protrusion at the L5-S1 level with a central component that does appear to contact the descending right S1 nerve root in the spinal canal.  Impression and Recommendations:

## 2014-02-20 NOTE — Assessment & Plan Note (Signed)
Asymptomatic at this point, he never needed his epidural.

## 2014-02-20 NOTE — Assessment & Plan Note (Addendum)
Clinically represents right-sided S1 radiculopathy, he had a fantastic response with approximately 45 days of complete relief after his first selective right-sided S1 block, second block did not provide any relief. Pain is exclusively radicular without and axial component, I think this makes him a good surgical candidate. He will probably need a nerve conduction study prior, and on the MRI I do see where his L5-S1 disc does appear to contact the right S1 nerve root in the spinal canal. I am going to refer him downstairs to [facility], and we are going to start gabapentin in and up taper.  Return to see me in one month.  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

## 2014-03-05 ENCOUNTER — Other Ambulatory Visit (HOSPITAL_COMMUNITY): Payer: Self-pay | Admitting: Neurosurgery

## 2014-03-05 DIAGNOSIS — M5416 Radiculopathy, lumbar region: Secondary | ICD-10-CM

## 2014-03-07 ENCOUNTER — Ambulatory Visit (HOSPITAL_COMMUNITY): Payer: BC Managed Care – PPO

## 2014-03-07 ENCOUNTER — Ambulatory Visit (HOSPITAL_COMMUNITY)
Admission: RE | Admit: 2014-03-07 | Discharge: 2014-03-07 | Disposition: A | Discharge status: Home / Self Care | Payer: BC Managed Care – PPO | Source: Ambulatory Visit | Attending: Neurosurgery | Admitting: Neurosurgery

## 2014-03-07 ENCOUNTER — Encounter (HOSPITAL_COMMUNITY): Payer: Self-pay

## 2014-03-07 DIAGNOSIS — M4806 Spinal stenosis, lumbar region: Secondary | ICD-10-CM | POA: Insufficient documentation

## 2014-03-07 DIAGNOSIS — M5127 Other intervertebral disc displacement, lumbosacral region: Secondary | ICD-10-CM | POA: Diagnosis not present

## 2014-03-07 DIAGNOSIS — M5416 Radiculopathy, lumbar region: Secondary | ICD-10-CM

## 2014-03-07 MED ORDER — DIAZEPAM 5 MG PO TABS
10.0000 mg | ORAL_TABLET | Freq: Once | ORAL | Status: AC
  Administered 2014-03-07: 10 mg via ORAL

## 2014-03-07 MED ORDER — DIAZEPAM 5 MG PO TABS
ORAL_TABLET | ORAL | Status: AC
  Filled 2014-03-07: qty 2

## 2014-03-07 MED ORDER — IOHEXOL 180 MG/ML  SOLN
20.0000 mL | Freq: Once | INTRAMUSCULAR | Status: AC | PRN
  Administered 2014-03-07: 20 mL via INTRATHECAL

## 2014-03-07 MED ORDER — OXYCODONE-ACETAMINOPHEN 5-325 MG PO TABS
ORAL_TABLET | ORAL | Status: AC
  Filled 2014-03-07: qty 1

## 2014-03-07 MED ORDER — OXYCODONE-ACETAMINOPHEN 5-325 MG PO TABS
1.0000 | ORAL_TABLET | ORAL | Status: DC | PRN
  Administered 2014-03-07: 1 via ORAL

## 2014-03-07 MED ORDER — ONDANSETRON HCL 4 MG/2ML IJ SOLN: 4.0000 mg | Freq: Four times a day (QID) | INTRAMUSCULAR | Status: DC | PRN

## 2014-03-07 NOTE — Discharge Instructions (Signed)
Myelography, Care After °These instructions give you information on caring for yourself after your procedure. Your doctor may also give you specific instructions. Call your doctor if you have any problems or questions after your procedure. °HOME CARE °· Rest often the first day. °· When you rest, lie flat, with your head slightly raised (elevated). °· Avoid heavy lifting and activity for 48 hours. °· You may take the bandage (dressing) off 1 day after the test. °GET HELP RIGHT AWAY IF:  °· You have a very bad headache. °· You have a fever. °MAKE SURE YOU: °· Understand these instructions. °· Will watch your condition. °· Will get help right away if you are not doing well or get worse. °Document Released: 09/29/2007 Document Revised: 05/06/2013 Document Reviewed: 04/10/2013 °ExitCare® Patient Information ©2015 [employer]. This information is not intended to replace advice given to you by your health care provider. Make sure you discuss any questions you have with your health care provider. ° ° °

## 2014-03-07 NOTE — Op Note (Signed)
03/07/2014 Lumbar Myelogram  PATIENT:  Richard Rios is a 49 y.o. male  PRE-OPERATIVE DIAGNOSIS:  Lumbago, radiculopathy  POST-OPERATIVE DIAGNOSIS:  same  PROCEDURE:  Lumbar Myelogram  SURGEON:  Damarrion Mimbs  ANESTHESIA:   local LOCAL MEDICATIONS USED:  LIDOCAINE  Procedure Note: Richard Rios is a 49 y.o. male whom Was taken to the fluoroscopy suite and  positioned prone on the fluoroscopy table. His back was prepared and draped in a sterile manner. I infiltrated 9 cc into the lumbar region. I then introduced a spinal needle into the thecal sac at the L3/4 interlaminar space. I infiltrated 20cc of Omnipaque 180 into the thecal sac. Fluoroscopy showed the needle and contrast in the thecal sac. Richard Rios tolerated the procedure well. he Will be taken to CT for evaluation.     PATIENT DISPOSITION:  PACU - hemodynamically stable.

## 2014-03-20 ENCOUNTER — Encounter: Payer: Self-pay | Admitting: Sports Medicine

## 2014-03-20 ENCOUNTER — Telehealth: Payer: Self-pay

## 2014-03-20 ENCOUNTER — Ambulatory Visit (INDEPENDENT_AMBULATORY_CARE_PROVIDER_SITE_OTHER): Payer: BC Managed Care – PPO | Admitting: Sports Medicine

## 2014-03-20 VITALS — BP 146/89 | HR 83 | Ht 71.5 in | Wt 253.0 lb

## 2014-03-20 DIAGNOSIS — M5412 Radiculopathy, cervical region: Secondary | ICD-10-CM | POA: Diagnosis not present

## 2014-03-20 DIAGNOSIS — E785 Hyperlipidemia, unspecified: Secondary | ICD-10-CM

## 2014-03-20 DIAGNOSIS — I10 Essential (primary) hypertension: Secondary | ICD-10-CM

## 2014-03-20 DIAGNOSIS — E119 Type 2 diabetes mellitus without complications: Secondary | ICD-10-CM | POA: Diagnosis not present

## 2014-03-20 DIAGNOSIS — E669 Obesity, unspecified: Secondary | ICD-10-CM

## 2014-03-20 DIAGNOSIS — G5601 Carpal tunnel syndrome, right upper limb: Secondary | ICD-10-CM

## 2014-03-20 DIAGNOSIS — M24131 Other articular cartilage disorders, right wrist: Secondary | ICD-10-CM | POA: Insufficient documentation

## 2014-03-20 MED ORDER — ATORVASTATIN CALCIUM 40 MG PO TABS: 40.0000 mg | ORAL_TABLET | Freq: Every day | ORAL | Status: DC

## 2014-03-20 MED ORDER — PHENTERMINE HCL 37.5 MG PO CAPS: ORAL_CAPSULE | ORAL | Status: DC

## 2014-03-20 NOTE — Assessment & Plan Note (Signed)
Currently well controlled with diet. A1c was 6.5. We will help him with weight loss, and work on diabetes quality metrics at a future visit.

## 2014-03-20 NOTE — Telephone Encounter (Signed)
Could one of you ladies take care of this referral. Estelle Junehonda Tamela Elsayed,CMA

## 2014-03-20 NOTE — Assessment & Plan Note (Signed)
Restarting phentermine, return monthly for weight checks and refills. 

## 2014-03-20 NOTE — Telephone Encounter (Signed)
Patient called stated that he called Dr. Mikal Planeabell office and was told that another order for nerve conduction study test would need to be ordered by PCP. Rhonda Cunningham,CMA

## 2014-03-20 NOTE — Telephone Encounter (Signed)
Orders placed, this can be sent directly to [facility] said that they do the upper extremity nerve conduction at the same time as the lower extremity.

## 2014-03-20 NOTE — Progress Notes (Signed)
  Subjective:    CC: Follow-up multiple issues  HPI: Neck pain: With a left-sided C6-C7 disc protrusion that appears to cause mild foraminal stenosis, initially he responded extremely well to formal physical therapy. He is now having a recurrence of pain.  Right hand numbness: History of carpal tunnel release however I do not see any overlying scars suggestive of this. Initially it was thought that his right hand numbness was radicular however he describes a carpal tunnel/median nerve type distribution and is amenable to doing a median nerve hydrodissection under ultrasound guidance today.  Lumbar radiculopathy: Right S1, has been seen by neurosurgery, recent CT myelogram did show compression of the S1 nerve root by the L5-S1 disc however neurosurgery is not extremely eager to operate on protrusion the small. He has been on gabapentin which has not been effective. He does have another follow-up coming up with neurosurgery, they are going to do a nerve conduction study, I have asked him to have a nerve conduction study on the upper extremities well.  Diabetes mellitus type 2: A1c was 6.5, would like to get back on to weight loss medicine in the hopes of bringing this down.  Obesity: Desires to restart phentermine.  Hyperlipidemia: Has not started Lipitor.  Past medical history, Surgical history, Family history not pertinant except as noted below, Social history, Allergies, and medications have been entered into the medical record, reviewed, and no changes needed.   Review of Systems: No fevers, chills, night sweats, weight loss, chest pain, or shortness of breath.   Objective:    General: Well Developed, well nourished, and in no acute distress.  Neuro: Alert and oriented x3, extra-ocular muscles intact, sensation grossly intact.  HEENT: Normocephalic, atraumatic, pupils equal round reactive to light, neck supple, no masses, no lymphadenopathy, thyroid nonpalpable.  Skin: Warm and dry, no  rashes. Cardiac: Regular rate and rhythm, no murmurs rubs or gallops, no lower extremity edema.  Respiratory: Clear to auscultation bilaterally. Not using accessory muscles, speaking in full sentences. Right Wrist: Inspection normal with no visible erythema or swelling. ROM smooth and normal with good flexion and extension and ulnar/radial deviation that is symmetrical with opposite wrist. Palpation is normal over metacarpals, navicular, lunate, and TFCC; tendons without tenderness/ swelling No snuffbox tenderness. No tenderness over Canal of Guyon. Strength 5/5 in all directions without pain. Negative Finkelstein, tinel's and phalens. Negative Watson's test.  Procedure: Real-time Ultrasound Guided Injection/hydrodissection of right median nerve Device: GE Logiq E  Verbal informed consent obtained.  Time-out conducted.  Noted no overlying erythema, induration, or other signs of local infection.  Skin prepped in a sterile fashion.  Local anesthesia: Topical Ethyl chloride.  With sterile technique and under real time ultrasound guidance:  Visualized the median nerve, using a total of 1 mL Kenalog 40, 4 mL lidocaine injected medication both superficial to and deep to the median nerve in the carpal tunnel freeing it from the transverse carpal ligament, medication was also injected deep into the carpal tunnel around the tendinous structures. Completed without difficulty  Pain immediately resolved suggesting accurate placement of the medication.  Advised to call if fevers/chills, erythema, induration, drainage, or persistent bleeding.  Images permanently stored and available for review in the ultrasound unit.  Impression: Technically successful ultrasound guided injection.  Impression and Recommendations:    I spent 40 minutes with this patient, greater than 50% was face-to-face time counseling regarding the above multiple diagnoses

## 2014-03-20 NOTE — Assessment & Plan Note (Signed)
If still elevated at the one-month follow-up we will start lisinopril.

## 2014-03-20 NOTE — Assessment & Plan Note (Signed)
Continue Lipitor 40.

## 2014-03-20 NOTE — Assessment & Plan Note (Signed)
Median nerve hydrodissection as above. He does have nerve conduction studies coming up.

## 2014-03-20 NOTE — Assessment & Plan Note (Signed)
With a focal left-sided C6-C7 protrusion. We will complete the nerve conduction study before proceeding with cervical epidural. Initially his neck and radicular symptoms had resolved with physical therapy.

## 2014-03-21 NOTE — Telephone Encounter (Signed)
Richard Rios  have taken care of this.

## 2014-03-25 ENCOUNTER — Institutional Professional Consult (permissible substitution): Payer: BC Managed Care – PPO | Admitting: Family Medicine

## 2014-04-04 ENCOUNTER — Encounter: Payer: Self-pay | Admitting: Sports Medicine

## 2014-04-17 ENCOUNTER — Ambulatory Visit (INDEPENDENT_AMBULATORY_CARE_PROVIDER_SITE_OTHER): Payer: BC Managed Care – PPO | Admitting: Sports Medicine

## 2014-04-17 ENCOUNTER — Encounter: Payer: Self-pay | Admitting: Sports Medicine

## 2014-04-17 VITALS — BP 120/78 | HR 114 | Ht 71.0 in | Wt 244.0 lb

## 2014-04-17 DIAGNOSIS — G5601 Carpal tunnel syndrome, right upper limb: Secondary | ICD-10-CM | POA: Diagnosis not present

## 2014-04-17 DIAGNOSIS — E669 Obesity, unspecified: Secondary | ICD-10-CM | POA: Diagnosis not present

## 2014-04-17 DIAGNOSIS — I10 Essential (primary) hypertension: Secondary | ICD-10-CM | POA: Diagnosis not present

## 2014-04-17 DIAGNOSIS — M5416 Radiculopathy, lumbar region: Secondary | ICD-10-CM | POA: Diagnosis not present

## 2014-04-17 MED ORDER — PHENTERMINE HCL 37.5 MG PO CAPS
ORAL_CAPSULE | ORAL | Status: DC
Start: 2014-04-17 — End: 2014-05-19

## 2014-04-17 NOTE — Progress Notes (Signed)
  Subjective:    CC: Follow-up  HPI: Right-sided carpal tunnel syndrome: Completely resolved after ultrasound-guided median nerve hydrodissection at the last visit.  Obesity: 9 pound weight loss since the previous visit on phentermine.  Lumbar radiculopathy: Is being scheduled for surgery now.  Past medical history, Surgical history, Family history not pertinant except as noted below, Social history, Allergies, and medications have been entered into the medical record, reviewed, and no changes needed.   Review of Systems: No fevers, chills, night sweats, weight loss, chest pain, or shortness of breath.   Objective:    General: Well Developed, well nourished, and in no acute distress.  Neuro: Alert and oriented x3, extra-ocular muscles intact, sensation grossly intact.  HEENT: Normocephalic, atraumatic, pupils equal round reactive to light, neck supple, no masses, no lymphadenopathy, thyroid nonpalpable.  Skin: Warm and dry, no rashes. Cardiac: Regular rate and rhythm, no murmurs rubs or gallops, no lower extremity edema.  Respiratory: Clear to auscultation bilaterally. Not using accessory muscles, speaking in full sentences.  Impression and Recommendations:

## 2014-04-17 NOTE — Assessment & Plan Note (Signed)
Has already been through therapy, oral medications, steroids, epidurals have provided temporary relief. The problem was seen by neurosurgery on his recent myelogram. He is scheduled for surgery coming up.

## 2014-04-17 NOTE — Assessment & Plan Note (Signed)
9 pound weight loss. Return in one month, as we enter the second month.

## 2014-04-17 NOTE — Assessment & Plan Note (Signed)
Diet-controlled, return as needed., We will monitor this.

## 2014-04-17 NOTE — Assessment & Plan Note (Signed)
Completely resolved after right-sided median nerve hydrodissection at the last visit.

## 2014-04-29 ENCOUNTER — Encounter: Payer: Self-pay | Admitting: Sports Medicine

## 2014-05-16 ENCOUNTER — Other Ambulatory Visit: Payer: Self-pay | Admitting: Sports Medicine

## 2014-05-19 ENCOUNTER — Ambulatory Visit (INDEPENDENT_AMBULATORY_CARE_PROVIDER_SITE_OTHER): Payer: BC Managed Care – PPO | Admitting: Sports Medicine

## 2014-05-19 ENCOUNTER — Encounter: Payer: Self-pay | Admitting: Sports Medicine

## 2014-05-19 VITALS — BP 121/75 | HR 67 | Ht 71.0 in | Wt 246.0 lb

## 2014-05-19 DIAGNOSIS — E669 Obesity, unspecified: Secondary | ICD-10-CM

## 2014-05-19 MED ORDER — TRAMADOL HCL 50 MG PO TABS: ORAL_TABLET | ORAL | Status: DC

## 2014-05-19 MED ORDER — PHENTERMINE HCL 37.5 MG PO CAPS: ORAL_CAPSULE | ORAL | Status: DC

## 2014-05-19 NOTE — Progress Notes (Signed)
  Subjective:    CC: Follow-up  HPI: Obesity: Has actually gained a little bit of weight, has been intermittent with the medication, he coaches football and lacrosse, and has been on the road significantly during the state championship.  Past medical history, Surgical history, Family history not pertinant except as noted below, Social history, Allergies, and medications have been entered into the medical record, reviewed, and no changes needed.   Review of Systems: No fevers, chills, night sweats, weight loss, chest pain, or shortness of breath.   Objective:    General: Well Developed, well nourished, and in no acute distress.  Neuro: Alert and oriented x3, extra-ocular muscles intact, sensation grossly intact.  HEENT: Normocephalic, atraumatic, pupils equal round reactive to light, neck supple, no masses, no lymphadenopathy, thyroid nonpalpable.  Skin: Warm and dry, no rashes. Cardiac: Regular rate and rhythm, no murmurs rubs or gallops, no lower extremity edema.  Respiratory: Clear to auscultation bilaterally. Not using accessory muscles, speaking in full sentences.  Impression and Recommendations:

## 2014-05-19 NOTE — Addendum Note (Signed)
 Addended by: Monica BectonHEKKEKANDAM,  J on: 05/19/2014 12:57 PM   Modules accepted: Orders

## 2014-05-19 NOTE — Assessment & Plan Note (Signed)
Slight amount of weight gain, we can restart phentermine. we're entering the third month. I would like him to look into Saxenda as well, he will call me if he would like to do this and I can provide him with a discount coupon as well as a prescription. 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

## 2014-05-19 NOTE — Patient Instructions (Signed)
Look into Saxenda, and if you want to try this and let me know and we will provide a prescription as well as a discount coupon.

## 2014-05-22 ENCOUNTER — Encounter: Payer: Self-pay | Admitting: Sports Medicine

## 2014-05-22 MED ORDER — LIRAGLUTIDE -WEIGHT MANAGEMENT 18 MG/3ML ~~LOC~~ SOPN: 3.0000 mg | PEN_INJECTOR | Freq: Every day | SUBCUTANEOUS | Status: DC

## 2014-05-26 ENCOUNTER — Telehealth: Payer: Self-pay | Admitting: Sports Medicine

## 2014-05-26 NOTE — Telephone Encounter (Signed)
Received fax for Prior authorization on Saxenda sent through cover my meds and received authorization for medication. - CF

## 2014-05-26 NOTE — Telephone Encounter (Signed)
Received fax from [facility] and Bernie CoveySaxenda was approved from 04/26/2014 - 09/23/2014. Case Id [id number]- CF

## 2014-06-19 ENCOUNTER — Ambulatory Visit: Payer: BC Managed Care – PPO | Admitting: Sports Medicine

## 2014-06-19 ENCOUNTER — Telehealth: Payer: Self-pay | Admitting: Sports Medicine

## 2014-06-19 NOTE — Telephone Encounter (Signed)
Patient advised he needs these meds upated in his chart Hydrocodon 5-325, Oxycodone 5-325, Cyclobenzaprine-10mg . Thanks

## 2014-06-20 NOTE — Telephone Encounter (Signed)
To rhonda

## 2014-06-20 NOTE — Telephone Encounter (Signed)
Medications has been added to chart. Rhonda Cunningham,CMA

## 2014-06-24 ENCOUNTER — Ambulatory Visit: Payer: BC Managed Care – PPO | Admitting: Sports Medicine

## 2014-07-02 ENCOUNTER — Ambulatory Visit (INDEPENDENT_AMBULATORY_CARE_PROVIDER_SITE_OTHER): Payer: BC Managed Care – PPO | Admitting: Sports Medicine

## 2014-07-02 ENCOUNTER — Encounter: Payer: Self-pay | Admitting: Sports Medicine

## 2014-07-02 VITALS — BP 131/95 | HR 128 | Ht 71.0 in | Wt 249.0 lb

## 2014-07-02 DIAGNOSIS — M5416 Radiculopathy, lumbar region: Secondary | ICD-10-CM

## 2014-07-02 DIAGNOSIS — E669 Obesity, unspecified: Secondary | ICD-10-CM | POA: Diagnosis not present

## 2014-07-02 NOTE — Assessment & Plan Note (Signed)
Was off of phentermine for his spinal decompression. Has not yet refilled the prescription provided last month, he is also relatively maintained weight. He will continue with phentermine and return in one month, because we missed a month we will continue to consider him entering the third month. Also has not yet started Saxenda.

## 2014-07-02 NOTE — Patient Instructions (Signed)
Print coupon at [email or web address]

## 2014-07-02 NOTE — Progress Notes (Signed)
  Subjective:    CC: Follow-up  HPI: Right lumbar radiculopathy: Failed epidurals, subsequently had an S1 decompressive procedure, has improved slightly but still has some new right-sided S1 type paresthesias, he did have a new MRI with IV contrast by his neurosurgeon but unfortunately nothing was seen that would be operable. He has another visit coming up with his neurosurgeon later this week.  Obesity: 40 pound weight loss total on phentermine, we are currently entering essentially has third month, as he did not fill the medication last month due to surgery. He has not yet been able to obtain Saxenda.  Past medical history, Surgical history, Family history not pertinant except as noted below, Social history, Allergies, and medications have been entered into the medical record, reviewed, and no changes needed.   Review of Systems: No fevers, chills, night sweats, weight loss, chest pain, or shortness of breath.   Objective:    General: Well Developed, well nourished, and in no acute distress.  Neuro: Alert and oriented x3, extra-ocular muscles intact, sensation grossly intact.  HEENT: Normocephalic, atraumatic, pupils equal round reactive to light, neck supple, no masses, no lymphadenopathy, thyroid nonpalpable.  Skin: Warm and dry, no rashes. Cardiac: Regular rate and rhythm, no murmurs rubs or gallops, no lower extremity edema.  Respiratory: Clear to auscultation bilaterally. Not using accessory muscles, speaking in full sentences.  Impression and Recommendations:    I spent 25 minutes with this patient, greater than 50% was face-to-face time counseling regarding the diagnosis of lumbar radiculopathy

## 2014-07-02 NOTE — Assessment & Plan Note (Signed)
Persistent, and slightly worsening S1 radiculopathy on the right post spinal decompression. He did have a new MRI with contrast by his neurosurgeon. We did discuss that occasionally when conservative measures means that the nerve has been compressed for to long and will not recover. I'm happy to proceed with neuropathic agents once he is released from neurosurgery.

## 2014-07-03 ENCOUNTER — Encounter: Payer: Self-pay | Admitting: Sports Medicine

## 2014-07-10 ENCOUNTER — Telehealth: Payer: Self-pay | Admitting: Sports Medicine

## 2014-07-10 NOTE — Telephone Encounter (Signed)
Received fax from [facility] stating Richard Rios is scheduled on 07/15/2014 at 9:00 am - CF

## 2014-07-30 ENCOUNTER — Ambulatory Visit: Payer: BC Managed Care – PPO | Admitting: Sports Medicine

## 2014-08-05 ENCOUNTER — Other Ambulatory Visit: Payer: Self-pay | Admitting: Sports Medicine

## 2014-08-06 MED ORDER — LIRAGLUTIDE -WEIGHT MANAGEMENT 18 MG/3ML ~~LOC~~ SOPN: 3.0000 mg | PEN_INJECTOR | Freq: Every day | SUBCUTANEOUS | Status: DC

## 2014-08-06 NOTE — Addendum Note (Signed)
Addended by: Monica Becton on: 08/06/2014 02:58 PM   Modules accepted: Orders

## 2014-08-15 ENCOUNTER — Encounter: Payer: Self-pay | Admitting: Sports Medicine

## 2014-08-15 MED ORDER — DEXLANSOPRAZOLE 60 MG PO CPDR: 60.0000 mg | DELAYED_RELEASE_CAPSULE | Freq: Every day | ORAL | Status: DC

## 2014-08-19 ENCOUNTER — Encounter: Payer: Self-pay | Admitting: Sports Medicine

## 2014-08-19 ENCOUNTER — Telehealth: Payer: Self-pay | Admitting: Sports Medicine

## 2014-08-19 ENCOUNTER — Ambulatory Visit (INDEPENDENT_AMBULATORY_CARE_PROVIDER_SITE_OTHER): Payer: BC Managed Care – PPO | Admitting: Sports Medicine

## 2014-08-19 VITALS — BP 140/83 | HR 104 | Ht 71.0 in | Wt 249.0 lb

## 2014-08-19 DIAGNOSIS — E669 Obesity, unspecified: Secondary | ICD-10-CM | POA: Diagnosis not present

## 2014-08-19 DIAGNOSIS — G5601 Carpal tunnel syndrome, right upper limb: Secondary | ICD-10-CM | POA: Diagnosis not present

## 2014-08-19 MED ORDER — PHENTERMINE HCL 37.5 MG PO TABS: ORAL_TABLET | ORAL | Status: DC

## 2014-08-19 MED ORDER — CYCLOBENZAPRINE HCL 10 MG PO TABS: 10.0000 mg | ORAL_TABLET | Freq: Three times a day (TID) | ORAL | Status: DC | PRN

## 2014-08-19 NOTE — Assessment & Plan Note (Signed)
Currently at 2.4 mg of Saxenda, refilling phentermine. He did maintain weight. He is having some acid reflux likely secondary to Saxenda, has not yet started Dexilant. We are entering the fourth month of phentermine

## 2014-08-19 NOTE — Assessment & Plan Note (Signed)
Repeat median nerve hydrodissection, good 5 month response

## 2014-08-19 NOTE — Telephone Encounter (Signed)
Received fax for prior authorization on dexilant 60 mg sent through cover my meds and received authorization good from 07/20/2014 - 08/19/2015. Case ID [id number]. Pharmacy has been notified. - CF

## 2014-08-19 NOTE — Progress Notes (Signed)
  Subjective:    CC: follow-up  HPI: Obesity: Weight has remained about the same, currently on 2.4 mg Saxenda, needs a refill on phentermine.  Carpal tunnel syndrome: Doing well 5 months post median nerve hydrodissection on the right, desires repeat interventional treatment today.  Lumbar radiculopathy: Doing well post lumbar decompression surgery  Past medical history, Surgical history, Family history not pertinant except as noted below, Social history, Allergies, and medications have been entered into the medical record, reviewed, and no changes needed.   Review of Systems: No fevers, chills, night sweats, weight loss, chest pain, or shortness of breath.   Objective:    General: Well Developed, well nourished, and in no acute distress.  Neuro: Alert and oriented x3, extra-ocular muscles intact, sensation grossly intact.  HEENT: Normocephalic, atraumatic, pupils equal round reactive to light, neck supple, no masses, no lymphadenopathy, thyroid nonpalpable.  Skin: Warm and dry, no rashes. Cardiac: Regular rate and rhythm, no murmurs rubs or gallops, no lower extremity edema.  Respiratory: Clear to auscultation bilaterally. Not using accessory muscles, speaking in full sentences.  Procedure: Real-time Ultrasound Guided Injection of right median nerve hydrodissection Device: GE Logiq E  Verbal informed consent obtained.  Time-out conducted.  Noted no overlying erythema, induration, or other signs of local infection.  Skin prepped in a sterile fashion.  Local anesthesia: Topical Ethyl chloride.  With sterile technique and under real time ultrasound guidance:  25-gauge needle advanced into the carpal tunnel, medication injected both superficial to and deep to the median nerve, free from surrounding structures, need redirected and medication injected deep to the carpal tunnels well. Completed without difficulty  Pain immediately resolved suggesting accurate placement of the medication.    Advised to call if fevers/chills, erythema, induration, drainage, or persistent bleeding.  Images permanently stored and available for review in the ultrasound unit.  Impression: Technically successful ultrasound guided injection.  Impression and Recommendations:    I spent 25 minutes with this patient, greater than 50% was face-to-face time counseling regarding the above diagnoses

## 2014-08-27 ENCOUNTER — Encounter: Payer: Self-pay | Admitting: Sports Medicine

## 2014-09-05 ENCOUNTER — Encounter: Payer: Self-pay | Admitting: Sports Medicine

## 2014-09-05 MED ORDER — DEXLANSOPRAZOLE 60 MG PO CPDR: 60.0000 mg | DELAYED_RELEASE_CAPSULE | Freq: Every day | ORAL | Status: DC

## 2014-09-16 ENCOUNTER — Ambulatory Visit: Payer: BC Managed Care – PPO | Admitting: Sports Medicine

## 2014-09-17 ENCOUNTER — Encounter: Payer: Self-pay | Admitting: Sports Medicine

## 2014-09-18 ENCOUNTER — Encounter: Payer: Self-pay | Admitting: Sports Medicine

## 2014-09-18 MED ORDER — HYDROCORTISONE ACETATE 25 MG RE SUPP: 25.0000 mg | Freq: Two times a day (BID) | RECTAL | Status: DC | PRN

## 2014-09-18 MED ORDER — DOCUSATE SODIUM 100 MG PO CAPS: 100.0000 mg | ORAL_CAPSULE | Freq: Three times a day (TID) | ORAL | Status: DC | PRN

## 2014-09-18 MED ORDER — WITCH HAZEL-GLYCERIN EX PADS: 1.0000 "application " | MEDICATED_PAD | CUTANEOUS | Status: DC | PRN

## 2014-09-18 NOTE — Addendum Note (Signed)
Addended by: Monica Becton on: 09/18/2014 12:13 PM   Modules accepted: Orders

## 2014-09-19 ENCOUNTER — Encounter: Payer: Self-pay | Admitting: Sports Medicine

## 2014-09-19 ENCOUNTER — Ambulatory Visit (INDEPENDENT_AMBULATORY_CARE_PROVIDER_SITE_OTHER): Payer: BC Managed Care – PPO | Admitting: Sports Medicine

## 2014-09-19 VITALS — BP 133/86 | HR 105 | Ht 71.0 in | Wt 238.0 lb

## 2014-09-19 DIAGNOSIS — K648 Other hemorrhoids: Secondary | ICD-10-CM | POA: Diagnosis not present

## 2014-09-19 DIAGNOSIS — E669 Obesity, unspecified: Secondary | ICD-10-CM | POA: Diagnosis not present

## 2014-09-19 DIAGNOSIS — G5601 Carpal tunnel syndrome, right upper limb: Secondary | ICD-10-CM

## 2014-09-19 DIAGNOSIS — K644 Residual hemorrhoidal skin tags: Secondary | ICD-10-CM | POA: Insufficient documentation

## 2014-09-19 MED ORDER — LIRAGLUTIDE -WEIGHT MANAGEMENT 18 MG/3ML ~~LOC~~ SOPN: 3.0000 mg | PEN_INJECTOR | Freq: Every day | SUBCUTANEOUS | Status: DC

## 2014-09-19 MED ORDER — DEXLANSOPRAZOLE 60 MG PO CPDR: 60.0000 mg | DELAYED_RELEASE_CAPSULE | Freq: Every day | ORAL | Status: DC

## 2014-09-19 NOTE — Assessment & Plan Note (Signed)
Overall doing well, pain has decreased and predominantly only itchy now. He has not yet started the Anusol [facility] suppositories, Tucks pads, or stool softener. Should he decide to have a hemorrhoidectomy, I'm happy to do this here in the office.

## 2014-09-19 NOTE — Progress Notes (Signed)
  Subjective:    CC: Follow-up  HPI: Obesity: Good continued weight loss as we into the fifth month of phentermine, he is also at 3 mg on Saxenda.  Carpal tunnel syndrome: Doing well.  External hemorrhoid: Present 2 weeks now, overall improving, pain has now improved to the point where he simply has some itchiness, he has not yet tried the suppositories, witch hazel pads, or stool softeners.  Past medical history, Surgical history, Family history not pertinant except as noted below, Social history, Allergies, and medications have been entered into the medical record, reviewed, and no changes needed.   Review of Systems: No fevers, chills, night sweats, weight loss, chest pain, or shortness of breath.   Objective:    General: Well Developed, well nourished, and in no acute distress.  Neuro: Alert and oriented x3, extra-ocular muscles intact, sensation grossly intact.  HEENT: Normocephalic, atraumatic, pupils equal round reactive to light, neck supple, no masses, no lymphadenopathy, thyroid nonpalpable.  Skin: Warm and dry, no rashes. Cardiac: Regular rate and rhythm, no murmurs rubs or gallops, no lower extremity edema.  Respiratory: Clear to auscultation bilaterally. Not using accessory muscles, speaking in full sentences.  Impression and Recommendations:

## 2014-09-19 NOTE — Assessment & Plan Note (Signed)
Continues to do well 

## 2014-09-19 NOTE — Assessment & Plan Note (Signed)
Good continued weight loss, he is up to 3 mg of Saxenda, he already has the phentermine to last another month as we into the fifth month. Return to see me in one month.

## 2014-09-28 ENCOUNTER — Encounter: Payer: Self-pay | Admitting: Sports Medicine

## 2014-10-03 ENCOUNTER — Ambulatory Visit (INDEPENDENT_AMBULATORY_CARE_PROVIDER_SITE_OTHER): Payer: BC Managed Care – PPO | Admitting: Sports Medicine

## 2014-10-03 ENCOUNTER — Encounter: Payer: Self-pay | Admitting: Sports Medicine

## 2014-10-03 VITALS — BP 137/86 | HR 107 | Ht 71.0 in | Wt 236.0 lb

## 2014-10-03 DIAGNOSIS — K648 Other hemorrhoids: Secondary | ICD-10-CM | POA: Diagnosis not present

## 2014-10-03 DIAGNOSIS — K644 Residual hemorrhoidal skin tags: Secondary | ICD-10-CM

## 2014-10-03 MED ORDER — HYDROCODONE-ACETAMINOPHEN 5-325 MG PO TABS: 1.0000 | ORAL_TABLET | Freq: Three times a day (TID) | ORAL | Status: DC | PRN

## 2014-10-03 NOTE — Progress Notes (Signed)
  Subjective:    CC: persistent hemorrhoid pain  HPI: This is a pleasant 49 year old male, for the past 2 weeks he's had increasing pain from a hemorrhoid with a large protruding external hemorrhoid, we have done witch hazel, suppositories, stool softeners. Unfortunately he continues to have itching and discomfort. At this point he desires surgical hemorrhoidectomy.  Past medical history, Surgical history, Family history not pertinant except as noted below, Social history, Allergies, and medications have been entered into the medical record, reviewed, and no changes needed.   Review of Systems: No fevers, chills, night sweats, weight loss, chest pain, or shortness of breath.   Objective:    General: Well Developed, well nourished, and in no acute distress.  Neuro: Alert and oriented x3, extra-ocular muscles intact, sensation grossly intact.  HEENT: Normocephalic, atraumatic, pupils equal round reactive to light, neck supple, no masses, no lymphadenopathy, thyroid nonpalpable.  Skin: Warm and dry, no rashes. Cardiac: Regular rate and rhythm, no murmurs rubs or gallops, no lower extremity edema.  Respiratory: Clear to auscultation bilaterally. Not using accessory muscles, speaking in full sentences. Rectal: There is a 1 cm external hemorrhoid that does not appear infected, this is at the 3:00 position.  External hemorrhoid thrombectomy. Risks, benefits, and alternatives explained and consent obtained. Time out conducted. Surface cleaned with alcohol. 5cc lidocaine with epinephine infiltrated around abscess. Adequate anesthesia ensured. Area prepped and draped in a sterile fashion. #11 blade used to make a stab incision into abscess. Curved hemostat used to explore the hemorrhoid, I was able to remove the majority of the internal thrombus I then placed three 4-0 simple simple interrupted sutures to close the incision Hemostasis achieved. Pt stable. Aftercare and follow-up  advised.  Impression and Recommendations:    I spent 40 minutes with this patient, greater than 50% was face-to-face time counseling regarding the above diagnoses, this time was separate from the time spent during the procedure

## 2014-10-03 NOTE — Patient Instructions (Signed)

## 2014-10-03 NOTE — Assessment & Plan Note (Addendum)
Surgical hemorrhoidectomy with primary closure after failure of conservative measures. Return in one week for a wound check. Absorbable Vicryl sutures placed. Hemorrhoids sent off to pathology. Per patient request.

## 2014-10-07 ENCOUNTER — Ambulatory Visit: Payer: Self-pay | Admitting: Sports Medicine

## 2014-10-10 ENCOUNTER — Encounter: Payer: Self-pay | Admitting: Sports Medicine

## 2014-10-10 ENCOUNTER — Ambulatory Visit (INDEPENDENT_AMBULATORY_CARE_PROVIDER_SITE_OTHER): Payer: BC Managed Care – PPO | Admitting: Sports Medicine

## 2014-10-10 VITALS — BP 134/92 | HR 101 | Ht 71.0 in | Wt 240.0 lb

## 2014-10-10 DIAGNOSIS — K648 Other hemorrhoids: Secondary | ICD-10-CM

## 2014-10-10 DIAGNOSIS — K644 Residual hemorrhoidal skin tags: Secondary | ICD-10-CM

## 2014-10-10 NOTE — Assessment & Plan Note (Signed)
Doing extremely well post surgical hemorrhoidectomy at the last visit, still having some bleeding which is expected one. Sutures are still in place, and there is no sign of bacterial superinfection.

## 2014-10-10 NOTE — Progress Notes (Signed)
  Subjective:  1 week post surgical hemorrhoidectomy, doing well, still having a bit of bleeding but overall improving. Objective: General: Well-developed, well-nourished, and in no acute distress. Rectal: There is still a small amount of open wound with visible granulation tissue, no purulence, or evidence of bacterial superinfection.  Assessment/plan:

## 2014-10-20 ENCOUNTER — Ambulatory Visit: Payer: BC Managed Care – PPO | Admitting: Sports Medicine

## 2014-12-19 ENCOUNTER — Telehealth: Payer: Self-pay | Admitting: Sports Medicine

## 2014-12-19 ENCOUNTER — Encounter: Payer: Self-pay | Admitting: Sports Medicine

## 2014-12-19 ENCOUNTER — Ambulatory Visit (INDEPENDENT_AMBULATORY_CARE_PROVIDER_SITE_OTHER): Payer: BC Managed Care – PPO | Admitting: Sports Medicine

## 2014-12-19 VITALS — BP 148/95 | HR 90 | Wt 248.0 lb

## 2014-12-19 DIAGNOSIS — M25512 Pain in left shoulder: Secondary | ICD-10-CM | POA: Diagnosis not present

## 2014-12-19 DIAGNOSIS — E119 Type 2 diabetes mellitus without complications: Secondary | ICD-10-CM

## 2014-12-19 DIAGNOSIS — E669 Obesity, unspecified: Secondary | ICD-10-CM | POA: Diagnosis not present

## 2014-12-19 DIAGNOSIS — B360 Pityriasis versicolor: Secondary | ICD-10-CM | POA: Diagnosis not present

## 2014-12-19 DIAGNOSIS — Z23 Encounter for immunization: Secondary | ICD-10-CM | POA: Diagnosis not present

## 2014-12-19 MED ORDER — PHENTERMINE HCL 37.5 MG PO TABS: ORAL_TABLET | ORAL | Status: DC

## 2014-12-19 MED ORDER — FLUCONAZOLE 150 MG PO TABS: 300.0000 mg | ORAL_TABLET | ORAL | Status: DC

## 2014-12-19 MED ORDER — LIRAGLUTIDE -WEIGHT MANAGEMENT 18 MG/3ML ~~LOC~~ SOPN: 3.0000 mg | PEN_INJECTOR | Freq: Every day | SUBCUTANEOUS | Status: DC

## 2014-12-19 NOTE — Telephone Encounter (Signed)
Received fax for prior authorization on Saxenda sent through cover my meds waiting on authorization. - CF °

## 2014-12-19 NOTE — Assessment & Plan Note (Signed)
Likely uncontrolled, foot exam done today, we are going to restart Saxenda so we will recheck his A1c in 3 months. Weight loss will also help. Adding urine microalbumin.

## 2014-12-19 NOTE — Assessment & Plan Note (Signed)
Good clearance with Diflucan in the past, restarting this.

## 2014-12-19 NOTE — Assessment & Plan Note (Signed)
Pain is referable to both the acromioclavicular joint as well as subacromial bursa. Injections placed into both of these structures today, home rehabilitation exercises, x-rays, return in one month.

## 2014-12-19 NOTE — Assessment & Plan Note (Signed)
Restarting phentermine and Saxenda, return monthly for weight checks.

## 2014-12-19 NOTE — Progress Notes (Signed)
  Subjective:    CC: Left shoulder pain  HPI: Richard Rios is a pleasant 49 year old male, for the past several weeks he said pain that he localizes both on the top and over the deltoid in his left shoulder, worse with overhead activities as well as reaching across his chest, no trauma. Pain is moderate, persistent.  Obesity: Desires to restart Saxenda and phentermine.  Diabetes mellitus type 2: Diet controlled, due for a few screening measures.  Tinea versicolor: This cleared with 300 mg of fluconazole weekly 2 earlier in the year, now having recurrence.  Past medical history, Surgical history, Family history not pertinant except as noted below, Social history, Allergies, and medications have been entered into the medical record, reviewed, and no changes needed.   Review of Systems: No fevers, chills, night sweats, weight loss, chest pain, or shortness of breath.   Objective:    General: Well Developed, well nourished, and in no acute distress.  Neuro: Alert and oriented x3, extra-ocular muscles intact, sensation grossly intact.  HEENT: Normocephalic, atraumatic, pupils equal round reactive to light, neck supple, no masses, no lymphadenopathy, thyroid nonpalpable.  Skin: Warm and dry, no rashes. Cardiac: Regular rate and rhythm, no murmurs rubs or gallops, no lower extremity edema.  Respiratory: Clear to auscultation bilaterally. Not using accessory muscles, speaking in full sentences. Left Shoulder: Inspection reveals no abnormalities, atrophy or asymmetry. Tender to palpation over the acromioclavicular joint ROM is full in all planes. Rotator cuff strength normal throughout. Positive Neer and Hawkin's tests, empty can. Speeds and Yergason's tests normal. No labral pathology noted with negative Obrien's, negative crank, negative clunk, and good stability. Normal scapular function observed. No painful arc and no drop arm sign. No apprehension sign  Procedure: Real-time Ultrasound  Guided Injection of left acromioclavicular joint Device: GE Logiq E  Verbal informed consent obtained.  Time-out conducted.  Noted no overlying erythema, induration, or other signs of local infection.  Skin prepped in a sterile fashion.  Local anesthesia: Topical Ethyl chloride.  With sterile technique and under real time ultrasound guidance:  25-gauge needle advanced into joint and 1 mL kenalog 40, 1 mL lidocaine injected easily. Completed without difficulty  Pain immediately resolved suggesting accurate placement of the medication.  Advised to call if fevers/chills, erythema, induration, drainage, or persistent bleeding.  Images permanently stored and available for review in the ultrasound unit.  Impression: Technically successful ultrasound guided injection.  Procedure: Real-time Ultrasound Guided Injection of left subacromial bursa Device: GE Logiq E  Verbal informed consent obtained.  Time-out conducted.  Noted no overlying erythema, induration, or other signs of local infection.  Skin prepped in a sterile fashion.  Local anesthesia: Topical Ethyl chloride.  With sterile technique and under real time ultrasound guidance:  Noted intact supraspinatus, 25-gauge needle advanced into bursa and 1 mL kenalog 40, 3 mL lidocaine injected easily. Completed without difficulty  Pain immediately resolved suggesting accurate placement of the medication.  Advised to call if fevers/chills, erythema, induration, drainage, or persistent bleeding.  Images permanently stored and available for review in the ultrasound unit.  Impression: Technically successful ultrasound guided injection.  Impression and Recommendations:

## 2015-01-08 ENCOUNTER — Encounter: Payer: Self-pay | Admitting: Sports Medicine

## 2015-01-09 ENCOUNTER — Ambulatory Visit (INDEPENDENT_AMBULATORY_CARE_PROVIDER_SITE_OTHER): Payer: BC Managed Care – PPO | Admitting: Sports Medicine

## 2015-01-09 VITALS — BP 137/90 | HR 104 | Temp 97.9°F | Resp 18

## 2015-01-09 DIAGNOSIS — Z23 Encounter for immunization: Secondary | ICD-10-CM | POA: Diagnosis not present

## 2015-01-09 DIAGNOSIS — K648 Other hemorrhoids: Secondary | ICD-10-CM

## 2015-01-09 DIAGNOSIS — K644 Residual hemorrhoidal skin tags: Secondary | ICD-10-CM

## 2015-01-09 NOTE — Progress Notes (Signed)
  Subjective:    CC: bright red blood per rectum  HPI: This is a pleasant 50 year old male, he has a history of external hemorrhoids. Unfortunately for the past several weeks he's had increasing bleeding, bright red from the rectum, no melena. No pain.  Past medical history, Surgical history, Family history not pertinant except as noted below, Social history, Allergies, and medications have been entered into the medical record, reviewed, and no changes needed.   Review of Systems: No fevers, chills, night sweats, weight loss, chest pain, or shortness of breath.   Objective:    General: Well Developed, well nourished, and in no acute distress.  Neuro: Alert and oriented x3, extra-ocular muscles intact, sensation grossly intact.  HEENT: Normocephalic, atraumatic, pupils equal round reactive to light, neck supple, no masses, no lymphadenopathy, thyroid nonpalpable.  Skin: Warm and dry, no rashes. Cardiac: Regular rate and rhythm, no murmurs rubs or gallops, no lower extremity edema.  Respiratory: Clear to auscultation bilaterally. Not using accessory muscles, speaking in full sentences. Anoscopy: No evidence of internal hemorrhoids or anal fissure, there is a bleeding external hemorrhoid at the 9:00 position. This was cauterized aggressively with the Hyfrecator. Hemostasis achieved.  Impression and Recommendations:

## 2015-01-09 NOTE — Assessment & Plan Note (Signed)
There was a bleeding external hemorrhoid at approximately 9:00, this was cauterized aggressively. Continue with stool softeners and follow up with gastroenterology.

## 2015-01-15 ENCOUNTER — Other Ambulatory Visit: Payer: Self-pay | Admitting: Sports Medicine

## 2015-01-15 DIAGNOSIS — M25512 Pain in left shoulder: Secondary | ICD-10-CM

## 2015-01-15 DIAGNOSIS — E119 Type 2 diabetes mellitus without complications: Secondary | ICD-10-CM

## 2015-01-16 ENCOUNTER — Encounter: Payer: Self-pay | Admitting: Sports Medicine

## 2015-01-16 ENCOUNTER — Ambulatory Visit (INDEPENDENT_AMBULATORY_CARE_PROVIDER_SITE_OTHER): Payer: BC Managed Care – PPO | Admitting: Sports Medicine

## 2015-01-16 VITALS — BP 143/89 | HR 104 | Temp 98.6°F | Resp 18 | Wt 243.5 lb

## 2015-01-16 DIAGNOSIS — K648 Other hemorrhoids: Secondary | ICD-10-CM

## 2015-01-16 DIAGNOSIS — E669 Obesity, unspecified: Secondary | ICD-10-CM | POA: Diagnosis not present

## 2015-01-16 DIAGNOSIS — K644 Residual hemorrhoidal skin tags: Secondary | ICD-10-CM

## 2015-01-16 LAB — MICROALBUMIN / CREATININE URINE RATIO
Creatinine, Urine: 394 mg/dL — ABNORMAL HIGH (ref 20–370)
Microalb Creat Ratio: 2 mcg/mg creat (ref <30)
Microalb, Ur: 0.9 mg/dL

## 2015-01-16 MED ORDER — PHENTERMINE HCL 37.5 MG PO TABS: ORAL_TABLET | ORAL | Status: DC

## 2015-01-16 NOTE — Assessment & Plan Note (Signed)
Bleeding hemorrhoid is now hemostatic after electrocautery at the last visit.

## 2015-01-16 NOTE — Progress Notes (Signed)
  Subjective:    CC: Follow-up  HPI: Obesity: Last month we restarted phentermine and Saxenda, He has lost 4 pounds.  Hemorrhoid: Doing well and hemostatic after electrocautery last week.  Past medical history, Surgical history, Family history not pertinant except as noted below, Social history, Allergies, and medications have been entered into the medical record, reviewed, and no changes needed.   Review of Systems: No fevers, chills, night sweats, weight loss, chest pain, or shortness of breath.   Objective:    General: Well Developed, well nourished, and in no acute distress.  Neuro: Alert and oriented x3, extra-ocular muscles intact, sensation grossly intact.  HEENT: Normocephalic, atraumatic, pupils equal round reactive to light, neck supple, no masses, no lymphadenopathy, thyroid nonpalpable.  Skin: Warm and dry, no rashes. Cardiac: Regular rate and rhythm, no murmurs rubs or gallops, no lower extremity edema.  Respiratory: Clear to auscultation bilaterally. Not using accessory muscles, speaking in full sentences.  Impression and Recommendations:

## 2015-01-16 NOTE — Assessment & Plan Note (Signed)
4 pound weight loss after the first month on restart of phentermine and Saxenda.

## 2015-01-22 ENCOUNTER — Encounter: Payer: Self-pay | Admitting: Sports Medicine

## 2015-01-23 ENCOUNTER — Encounter: Payer: Self-pay | Admitting: Sports Medicine

## 2015-01-29 ENCOUNTER — Telehealth: Payer: Self-pay | Admitting: *Deleted

## 2015-01-29 NOTE — Telephone Encounter (Signed)
lvm informing pt that I tried to get the PA for his Saxenda approved and was told that his account was terminated on 01/03/2015. They did check to see if he had any other active account and he did not. I asked that he call back if this information is incorrect.Loralee Pacas [place]

## 2015-02-02 ENCOUNTER — Encounter: Payer: Self-pay | Admitting: Sports Medicine

## 2015-02-02 DIAGNOSIS — R0789 Other chest pain: Secondary | ICD-10-CM

## 2015-02-17 ENCOUNTER — Ambulatory Visit: Payer: BC Managed Care – PPO | Admitting: Sports Medicine

## 2015-02-20 ENCOUNTER — Encounter: Payer: Self-pay | Admitting: Sports Medicine

## 2015-02-20 ENCOUNTER — Ambulatory Visit (INDEPENDENT_AMBULATORY_CARE_PROVIDER_SITE_OTHER): Payer: BC Managed Care – PPO | Admitting: Sports Medicine

## 2015-02-20 VITALS — BP 149/94 | HR 76 | Resp 18 | Wt 245.7 lb

## 2015-02-20 DIAGNOSIS — E669 Obesity, unspecified: Secondary | ICD-10-CM

## 2015-02-20 MED ORDER — PHENTERMINE HCL 37.5 MG PO TABS: ORAL_TABLET | ORAL | Status: DC

## 2015-02-20 NOTE — Assessment & Plan Note (Signed)
Entering the third month of phentermine, he has not been that compliant with his Saxenda. Refilling medications, return in one month. His son just got a full ride football scholarship to [employer].

## 2015-02-20 NOTE — Progress Notes (Signed)
  Subjective:    CC: Follow-up  HPI: Obesity: Did have a bit of weight gain, has not been fully compliant with his Saxenda.  Past medical history, Surgical history, Family history not pertinant except as noted below, Social history, Allergies, and medications have been entered into the medical record, reviewed, and no changes needed.   Review of Systems: No fevers, chills, night sweats, weight loss, chest pain, or shortness of breath.   Objective:    General: Well Developed, well nourished, and in no acute distress.  Neuro: Alert and oriented x3, extra-ocular muscles intact, sensation grossly intact.  HEENT: Normocephalic, atraumatic, pupils equal round reactive to light, neck supple, no masses, no lymphadenopathy, thyroid nonpalpable.  Skin: Warm and dry, no rashes. Cardiac: Regular rate and rhythm, no murmurs rubs or gallops, no lower extremity edema.  Respiratory: Clear to auscultation bilaterally. Not using accessory muscles, speaking in full sentences.  Impression and Recommendations:    I spent 25 minutes with this patient, greater than 50% was face-to-face time counseling regarding the above diagnoses

## 2015-02-25 ENCOUNTER — Encounter: Payer: BC Managed Care – PPO | Admitting: Physician Assistant

## 2015-03-04 ENCOUNTER — Ambulatory Visit (INDEPENDENT_AMBULATORY_CARE_PROVIDER_SITE_OTHER): Payer: BC Managed Care – PPO

## 2015-03-04 DIAGNOSIS — R0789 Other chest pain: Secondary | ICD-10-CM

## 2015-03-04 LAB — EXERCISE TOLERANCE TEST
Estimated workload: 13.4 METS
Exercise duration (min): 12 min
Exercise duration (sec): 0 s
MPHR: 171 {beats}/min
Peak BP: 185 mmHg
Peak HR: 179 {beats}/min
Percent HR: 104 %
Percent of predicted max HR: 104 %
RPE: 14
Rest HR: 99 {beats}/min
Stage 1 DBP: 82 mmHg
Stage 1 Grade: 0 %
Stage 1 HR: 100 {beats}/min
Stage 1 SBP: 129 mmHg
Stage 1 Speed: 0 mph
Stage 2 Grade: 0 %
Stage 2 HR: 109 {beats}/min
Stage 2 Speed: 1 mph
Stage 3 Grade: 0 %
Stage 3 HR: 110 {beats}/min
Stage 3 Speed: 1 mph
Stage 4 DBP: 77 mmHg
Stage 4 Grade: 10 %
Stage 4 HR: 136 {beats}/min
Stage 4 SBP: 149 mmHg
Stage 4 Speed: 1.7 mph
Stage 5 DBP: 63 mmHg
Stage 5 Grade: 12 %
Stage 5 HR: 151 {beats}/min
Stage 5 SBP: 178 mmHg
Stage 5 Speed: 2.5 mph
Stage 6 DBP: 64 mmHg
Stage 6 Grade: 14 %
Stage 6 HR: 169 {beats}/min
Stage 6 SBP: 187 mmHg
Stage 6 Speed: 3.4 mph
Stage 7 DBP: 62 mmHg
Stage 7 Grade: 16 %
Stage 7 HR: 179 {beats}/min
Stage 7 SBP: 185 mmHg
Stage 7 Speed: 4.2 mph
Stage 8 DBP: 44 mmHg
Stage 8 Grade: 0 %
Stage 8 HR: 166 {beats}/min
Stage 8 SBP: 130 mmHg
Stage 8 Speed: 1.5 mph
Stage 9 DBP: 57 mmHg
Stage 9 Grade: 0 %
Stage 9 HR: 127 {beats}/min
Stage 9 SBP: 123 mmHg
Stage 9 Speed: 0 mph

## 2015-03-20 ENCOUNTER — Encounter: Payer: Self-pay | Admitting: Sports Medicine

## 2015-03-20 ENCOUNTER — Ambulatory Visit (INDEPENDENT_AMBULATORY_CARE_PROVIDER_SITE_OTHER): Payer: BC Managed Care – PPO | Admitting: Sports Medicine

## 2015-03-20 VITALS — BP 133/86 | HR 102 | Wt 242.2 lb

## 2015-03-20 DIAGNOSIS — E669 Obesity, unspecified: Secondary | ICD-10-CM

## 2015-03-20 MED ORDER — PHENTERMINE HCL 37.5 MG PO TABS: ORAL_TABLET | ORAL | Status: DC

## 2015-03-20 NOTE — Assessment & Plan Note (Signed)
Has lost a couple of additional pounds on Saxenda and phentermine, we are entering the fourth month. At 2.4 mg Saxenda. Return in one month.

## 2015-03-20 NOTE — Progress Notes (Signed)
  Subjective:    CC: Follow-up  HPI: Obesity: Good continued weight loss, no side effects, currently at 2.4 mg Saxenda.  Past medical history, Surgical history, Family history not pertinant except as noted below, Social history, Allergies, and medications have been entered into the medical record, reviewed, and no changes needed.   Review of Systems: No fevers, chills, night sweats, weight loss, chest pain, or shortness of breath.   Objective:    General: Well Developed, well nourished, and in no acute distress.  Neuro: Alert and oriented x3, extra-ocular muscles intact, sensation grossly intact.  HEENT: Normocephalic, atraumatic, pupils equal round reactive to light, neck supple, no masses, no lymphadenopathy, thyroid nonpalpable.  Skin: Warm and dry, no rashes. Cardiac: Regular rate and rhythm, no murmurs rubs or gallops, no lower extremity edema.  Respiratory: Clear to auscultation bilaterally. Not using accessory muscles, speaking in full sentences.  Impression and Recommendations:

## 2015-04-07 LAB — HM DIABETES EYE EXAM

## 2015-04-16 ENCOUNTER — Ambulatory Visit: Payer: BC Managed Care – PPO | Admitting: Sports Medicine

## 2015-04-22 ENCOUNTER — Ambulatory Visit (INDEPENDENT_AMBULATORY_CARE_PROVIDER_SITE_OTHER): Payer: BC Managed Care – PPO | Admitting: Sports Medicine

## 2015-04-22 ENCOUNTER — Encounter: Payer: Self-pay | Admitting: Sports Medicine

## 2015-04-22 VITALS — BP 130/85 | HR 88 | Resp 18 | Wt 242.3 lb

## 2015-04-22 DIAGNOSIS — G5601 Carpal tunnel syndrome, right upper limb: Secondary | ICD-10-CM | POA: Diagnosis not present

## 2015-04-22 DIAGNOSIS — E119 Type 2 diabetes mellitus without complications: Secondary | ICD-10-CM

## 2015-04-22 DIAGNOSIS — E669 Obesity, unspecified: Secondary | ICD-10-CM

## 2015-04-22 LAB — CBC
HCT: 44 % (ref 38.5–50.0)
Hemoglobin: 14.6 g/dL (ref 13.2–17.1)
MCH: 29.9 pg (ref 27.0–33.0)
MCHC: 33.2 g/dL (ref 32.0–36.0)
MCV: 90.2 fL (ref 80.0–100.0)
MPV: 10.4 fL (ref 7.5–12.5)
Platelets: 216 10*3/uL (ref 140–400)
RBC: 4.88 MIL/uL (ref 4.20–5.80)
RDW: 13.7 % (ref 11.0–15.0)
WBC: 5.4 10*3/uL (ref 3.8–10.8)

## 2015-04-22 LAB — HEMOGLOBIN A1C
Hgb A1c MFr Bld: 5.9 % — ABNORMAL HIGH (ref <5.7)
Mean Plasma Glucose: 123 mg/dL

## 2015-04-22 MED ORDER — PHENTERMINE HCL 37.5 MG PO TABS: ORAL_TABLET | ORAL | Status: DC

## 2015-04-22 NOTE — Assessment & Plan Note (Signed)
Weight is now stabilized, entering the fifth month, currently at the full dose Saxenda.

## 2015-04-22 NOTE — Assessment & Plan Note (Signed)
Has been almost a year since the previous median nerve hydrodissection, repeated today on the right side.

## 2015-04-22 NOTE — Assessment & Plan Note (Signed)
Rechecking an A1c

## 2015-04-22 NOTE — Progress Notes (Signed)
  Subjective:    CC:  Follow-up  HPI:  obesity: Weight is stable currently urine draining the fifth month of phentermine and he is on the max dose of Saxenda.   Right carpal tunnel syndrome: Nearly one year response to previous median nerve hydrodissection, repeat median nerve hydrodissection desired today.  Past medical history, Surgical history, Family history not pertinant except as noted below, Social history, Allergies, and medications have been entered into the medical record, reviewed, and no changes needed.   Review of Systems: No fevers, chills, night sweats, weight loss, chest pain, or shortness of breath.   Objective:    General: Well Developed, well nourished, and in no acute distress.  Neuro: Alert and oriented x3, extra-ocular muscles intact, sensation grossly intact.  HEENT: Normocephalic, atraumatic, pupils equal round reactive to light, neck supple, no masses, no lymphadenopathy, thyroid nonpalpable.  Skin: Warm and dry, no rashes. Cardiac: Regular rate and rhythm, no murmurs rubs or gallops, no lower extremity edema.  Respiratory: Clear to auscultation bilaterally. Not using accessory muscles, speaking in full sentences.  Procedure: Real-time Ultrasound Guided Right median nerve hydrodissection Device: GE Logiq E  Verbal informed consent obtained.  Time-out conducted.  Noted no overlying erythema, induration, or other signs of local infection.  Skin prepped in a sterile fashion.  Local anesthesia: Topical Ethyl chloride.  With sterile technique and under real time ultrasound guidance:   Using a 25-gauge needle advanced towards the median nerve in the carpal tunnel, taking care to avoid intraneural injection I injected medication both superficial to and deep to the median nerve freeing it from surrounding structures, the needle was redirected deep into the carpal tunnel and further medication was injected for total of 1 mL kenalog 40, 5 mL lidocaine. Completed without  difficulty  Pain immediately resolved suggesting accurate placement of the medication.  Advised to call if fevers/chills, erythema, induration, drainage, or persistent bleeding.  Images permanently stored and available for review in the ultrasound unit.  Impression: Technically successful ultrasound guided injection.  Impression and Recommendations:    I spent 25 minutes with this patient, greater than 50% was face-to-face time counseling regarding the above diagnoses, this time was separate from the time spent performing the procedure

## 2015-04-23 LAB — COMPREHENSIVE METABOLIC PANEL WITH GFR
ALT: 28 U/L (ref 9–46)
BUN: 12 mg/dL (ref 7–25)
Chloride: 102 mmol/L (ref 98–110)
Creat: 1.17 mg/dL (ref 0.60–1.35)
Glucose, Bld: 101 mg/dL — ABNORMAL HIGH (ref 65–99)
Potassium: 4.2 mmol/L (ref 3.5–5.3)
Sodium: 140 mmol/L (ref 135–146)
Total Protein: 7 g/dL (ref 6.1–8.1)

## 2015-04-23 LAB — VITAMIN D 25 HYDROXY (VIT D DEFICIENCY, FRACTURES): Vit D, 25-Hydroxy: 11 ng/mL — ABNORMAL LOW (ref 30–100)

## 2015-04-23 LAB — MICROALBUMIN / CREATININE URINE RATIO
Creatinine, Urine: 294 mg/dL (ref 20–370)
Microalb Creat Ratio: 2 mcg/mg creat (ref <30)
Microalb, Ur: 0.7 mg/dL

## 2015-04-23 LAB — LIPID PANEL
Cholesterol: 222 mg/dL — ABNORMAL HIGH (ref 125–200)
HDL: 38 mg/dL — ABNORMAL LOW (ref >=40)
LDL Cholesterol: 156 mg/dL — ABNORMAL HIGH (ref <130)
Total CHOL/HDL Ratio: 5.8 Ratio — ABNORMAL HIGH (ref <=5.0)
Triglycerides: 138 mg/dL (ref <150)
VLDL: 28 mg/dL (ref <30)

## 2015-04-23 LAB — COMPREHENSIVE METABOLIC PANEL
AST: 18 U/L (ref 10–40)
Albumin: 4 g/dL (ref 3.6–5.1)
Alkaline Phosphatase: 64 U/L (ref 40–115)
CO2: 28 mmol/L (ref 20–31)
Calcium: 9.3 mg/dL (ref 8.6–10.3)
Total Bilirubin: 0.5 mg/dL (ref 0.2–1.2)

## 2015-04-26 ENCOUNTER — Other Ambulatory Visit: Payer: Self-pay | Admitting: Sports Medicine

## 2015-04-26 DIAGNOSIS — E785 Hyperlipidemia, unspecified: Secondary | ICD-10-CM

## 2015-04-26 LAB — THYROID STIMULATING IMMUNOGLOBULIN: TSI: <89 %{baseline} (ref <140)

## 2015-04-26 MED ORDER — ATORVASTATIN CALCIUM 40 MG PO TABS: 40.0000 mg | ORAL_TABLET | Freq: Every day | ORAL | Status: AC

## 2015-04-26 MED ORDER — VITAMIN D (ERGOCALCIFEROL) 1.25 MG (50000 UNIT) PO CAPS: 50000.0000 [IU] | ORAL_CAPSULE | ORAL | Status: DC

## 2015-04-26 NOTE — Assessment & Plan Note (Signed)
Lipitor 40 seems to have been stopped, re-starting, recheck lipids in 3 months.

## 2015-04-30 ENCOUNTER — Encounter: Payer: Self-pay | Admitting: Sports Medicine

## 2015-04-30 MED ORDER — DEXLANSOPRAZOLE 60 MG PO CPDR: 60.0000 mg | DELAYED_RELEASE_CAPSULE | Freq: Every day | ORAL | Status: DC

## 2015-05-01 ENCOUNTER — Other Ambulatory Visit: Payer: Self-pay

## 2015-05-01 MED ORDER — DEXLANSOPRAZOLE 60 MG PO CPDR: 60.0000 mg | DELAYED_RELEASE_CAPSULE | Freq: Every day | ORAL | Status: AC

## 2015-05-07 ENCOUNTER — Encounter: Payer: Self-pay | Admitting: Sports Medicine

## 2015-05-17 ENCOUNTER — Encounter: Payer: Self-pay | Admitting: Sports Medicine

## 2015-05-18 MED ORDER — MONTELUKAST SODIUM 10 MG PO TABS: 10.0000 mg | ORAL_TABLET | Freq: Every day | ORAL | Status: DC

## 2015-05-18 MED ORDER — LEVOCETIRIZINE DIHYDROCHLORIDE 5 MG PO TABS: 5.0000 mg | ORAL_TABLET | Freq: Every evening | ORAL | Status: DC

## 2015-05-18 MED ORDER — PREDNISONE 50 MG PO TABS: ORAL_TABLET | ORAL | Status: DC

## 2015-05-18 NOTE — Telephone Encounter (Signed)
Rx should be sent under son Richard Rios([name]) chart, not this chart.

## 2015-05-18 NOTE — Addendum Note (Signed)
Addended by: Monica BectonHEKKEKANDAM, THOMAS J on: 05/18/2015 04:20 PM   Modules accepted: Orders, Medications

## 2015-05-18 NOTE — Telephone Encounter (Signed)
Ah shiz...lemme fix that.

## 2015-05-19 ENCOUNTER — Encounter: Payer: Self-pay | Admitting: Sports Medicine

## 2015-05-19 DIAGNOSIS — N50819 Testicular pain, unspecified: Secondary | ICD-10-CM

## 2015-05-20 ENCOUNTER — Encounter: Payer: Self-pay | Admitting: Sports Medicine

## 2015-05-20 ENCOUNTER — Ambulatory Visit (INDEPENDENT_AMBULATORY_CARE_PROVIDER_SITE_OTHER): Payer: BC Managed Care – PPO | Admitting: Sports Medicine

## 2015-05-20 VITALS — BP 137/83 | HR 96 | Resp 18 | Wt 242.8 lb

## 2015-05-20 DIAGNOSIS — G5601 Carpal tunnel syndrome, right upper limb: Secondary | ICD-10-CM | POA: Diagnosis not present

## 2015-05-20 DIAGNOSIS — E669 Obesity, unspecified: Secondary | ICD-10-CM | POA: Diagnosis not present

## 2015-05-20 MED ORDER — PHENTERMINE HCL 37.5 MG PO TABS: ORAL_TABLET | ORAL | Status: DC

## 2015-05-20 MED ORDER — LIRAGLUTIDE -WEIGHT MANAGEMENT 18 MG/3ML ~~LOC~~ SOPN: 3.0000 mg | PEN_INJECTOR | Freq: Every day | SUBCUTANEOUS | Status: DC

## 2015-05-20 NOTE — Assessment & Plan Note (Signed)
Resolved after median nerve hydrodissection at the last visit

## 2015-05-20 NOTE — Assessment & Plan Note (Signed)
Weight has stabilized on Saxenda. Entering the sixth month of phentermine

## 2015-05-20 NOTE — Progress Notes (Signed)
  Subjective:    CC: Follow-up  HPI: Obesity: Fantastic weight loss, weight is now overall stable. Entering the last month of phentermine.  Carpal tunnel syndrome: Resolved after median nerve hydrodissection at the last visit.  Past medical history, Surgical history, Family history not pertinant except as noted below, Social history, Allergies, and medications have been entered into the medical record, reviewed, and no changes needed.   Review of Systems: No fevers, chills, night sweats, weight loss, chest pain, or shortness of breath.   Objective:    General: Well Developed, well nourished, and in no acute distress.  Neuro: Alert and oriented x3, extra-ocular muscles intact, sensation grossly intact.  HEENT: Normocephalic, atraumatic, pupils equal round reactive to light, neck supple, no masses, no lymphadenopathy, thyroid nonpalpable.  Skin: Warm and dry, no rashes. Cardiac: Regular rate and rhythm, no murmurs rubs or gallops, no lower extremity edema.  Respiratory: Clear to auscultation bilaterally. Not using accessory muscles, speaking in full sentences.  Impression and Recommendations:

## 2015-05-22 ENCOUNTER — Telehealth: Payer: Self-pay | Admitting: *Deleted

## 2015-05-22 NOTE — Telephone Encounter (Signed)
saxenda approved  Pt notified

## 2015-05-22 NOTE — Telephone Encounter (Signed)
Key: Lebron [id number] - PA Case ID: [id number]   PA initiated for saxenda

## 2015-05-26 ENCOUNTER — Encounter: Payer: Self-pay | Admitting: Sports Medicine

## 2015-05-28 ENCOUNTER — Encounter: Payer: Self-pay | Admitting: Sports Medicine

## 2015-05-29 MED ORDER — TRIAMCINOLONE ACETONIDE 0.5 % EX CREA: 1.0000 "application " | TOPICAL_CREAM | Freq: Two times a day (BID) | CUTANEOUS | Status: AC

## 2015-06-22 ENCOUNTER — Ambulatory Visit (INDEPENDENT_AMBULATORY_CARE_PROVIDER_SITE_OTHER): Payer: BC Managed Care – PPO

## 2015-06-22 ENCOUNTER — Encounter: Payer: Self-pay | Admitting: Sports Medicine

## 2015-06-22 ENCOUNTER — Ambulatory Visit (INDEPENDENT_AMBULATORY_CARE_PROVIDER_SITE_OTHER): Payer: BC Managed Care – PPO | Admitting: Sports Medicine

## 2015-06-22 DIAGNOSIS — E669 Obesity, unspecified: Secondary | ICD-10-CM

## 2015-06-22 DIAGNOSIS — M25531 Pain in right wrist: Secondary | ICD-10-CM

## 2015-06-22 DIAGNOSIS — M948X3 Other specified disorders of cartilage, forearm: Secondary | ICD-10-CM

## 2015-06-22 DIAGNOSIS — M779 Enthesopathy, unspecified: Secondary | ICD-10-CM

## 2015-06-22 MED ORDER — BUPROPION HCL ER (XL) 150 MG PO TB24: ORAL_TABLET | ORAL | Status: AC

## 2015-06-22 MED ORDER — NALTREXONE HCL 50 MG PO TABS: ORAL_TABLET | ORAL | Status: AC

## 2015-06-22 NOTE — Assessment & Plan Note (Signed)
Has gained a bit of weight, discontinue phentermine, Saxenda was not covered, switching to generic Wellbutrin and naltrexone.

## 2015-06-22 NOTE — Progress Notes (Signed)
  Subjective:    CC: Follow-up  HPI: Obesity: Weight gain on phentermine  Right wrist pain: Localized at the ulnar aspect, moderate, persistent without radiation, persistent despite multiple injections, as well as physical therapy.  Past medical history, Surgical history, Family history not pertinant except as noted below, Social history, Allergies, and medications have been entered into the medical record, reviewed, and no changes needed.   Review of Systems: No fevers, chills, night sweats, weight loss, chest pain, or shortness of breath.   Objective:    General: Well Developed, well nourished, and in no acute distress.  Neuro: Alert and oriented x3, extra-ocular muscles intact, sensation grossly intact.  HEENT: Normocephalic, atraumatic, pupils equal round reactive to light, neck supple, no masses, no lymphadenopathy, thyroid nonpalpable.  Skin: Warm and dry, no rashes. Cardiac: Regular rate and rhythm, no murmurs rubs or gallops, no lower extremity edema.  Respiratory: Clear to auscultation bilaterally. Not using accessory muscles, speaking in full sentences. Right Wrist: Inspection normal with no visible erythema or swelling. ROM smooth and normal with good flexion and extension and ulnar/radial deviation that is symmetrical with opposite wrist. Tender to palpation over the sixth extensor compartment. No snuffbox tenderness. No tenderness over Canal of [place]. Strength 5/5 in all directions without pain. Negative Finkelstein, tinel's and phalens. Negative Watson's test.  Impression and Recommendations:    I spent 25 minutes with this patient, greater than 50% was face-to-face time counseling regarding the above diagnoses

## 2015-06-22 NOTE — Assessment & Plan Note (Signed)
Persistent pain despite physical therapy, multiple injections, pain is localized along the ulnar aspect of the wrist. MRI today, treatment will depend on results.

## 2015-06-23 ENCOUNTER — Encounter: Payer: Self-pay | Admitting: Sports Medicine

## 2015-06-24 ENCOUNTER — Encounter: Payer: Self-pay | Admitting: Sports Medicine

## 2015-06-24 ENCOUNTER — Ambulatory Visit (INDEPENDENT_AMBULATORY_CARE_PROVIDER_SITE_OTHER): Payer: BC Managed Care – PPO | Admitting: Sports Medicine

## 2015-06-24 VITALS — BP 122/83 | HR 89 | Resp 18 | Wt 247.8 lb

## 2015-06-24 DIAGNOSIS — M25331 Other instability, right wrist: Secondary | ICD-10-CM

## 2015-06-24 DIAGNOSIS — M24131 Other articular cartilage disorders, right wrist: Secondary | ICD-10-CM

## 2015-06-24 NOTE — Progress Notes (Signed)
  Subjective:    CC: MRI results  HPI:  This is a pleasant 50 year old male, we have been treating for carpal tunnel syndrome for some time now, more recently he's endorse pain over the dorsomedial wrist, we obtained an MRI that showed a central perforation of the TFCC disc. He is here for consideration of injection. Symptoms are moderate, persistent and localized just distal to the ulnar and a bit over the dorsal ulnocarpal joint.    Past medical history, Surgical history, Family history not pertinant except as noted below, Social history, Allergies, and medications have been entered into the medical record, reviewed, and no changes needed.   Review of Systems: No fevers, chills, night sweats, weight loss, chest pain, or shortness of breath.   Objective:    General: Well Developed, well nourished, and in no acute distress.  Neuro: Alert and oriented x3, extra-ocular muscles intact, sensation grossly intact.  HEENT: Normocephalic, atraumatic, pupils equal round reactive to light, neck supple, no masses, no lymphadenopathy, thyroid nonpalpable.  Skin: Warm and dry, no rashes. Cardiac: Regular rate and rhythm, no murmurs rubs or gallops, no lower extremity edema.  Respiratory: Clear to auscultation bilaterally. Not using accessory muscles, speaking in full sentences. Right Wrist: Inspection normal with no visible erythema or swelling. ROM smooth and normal with good flexion and extension and ulnar/radial deviation that is symmetrical with opposite wrist. Palpation is normal over metacarpals, navicular, lunate, and TFCC; tendons without tenderness/ swelling No snuffbox tenderness. Tenderness just distal to the ulnar styloid process Strength 5/5 in all directions without pain. Negative Finkelstein, tinel's and phalens. Negative Watson's test.  Procedure: Real-time Ultrasound Guided Injection of right TFCC disc Device: GE Logiq E  Verbal informed consent obtained.  Time-out conducted.    Noted no overlying erythema, induration, or other signs of local infection.  Skin prepped in a sterile fashion.  Local anesthesia: Topical Ethyl chloride.  With sterile technique and under real time ultrasound guidance:  25-gauge needle advanced just distal to the ulnar styloid process, just distal to the triangular fibrocartilagenous complex, I injected 1 mL kenalog 40, 1 mL lidocaine easily. Completed without difficulty  Pain immediately resolved suggesting accurate placement of the medication.  Advised to call if fevers/chills, erythema, induration, drainage, or persistent bleeding.  Images permanently stored and available for review in the ultrasound unit.  Impression: Technically successful ultrasound guided injection.  The wrist was then strapped with compressive dressing  Impression and Recommendations:

## 2015-06-24 NOTE — Assessment & Plan Note (Signed)
Likely type II (degenerative) as we don't have any discrete history of trauma. As failed therapy and immobilization. Injection placed around the TFCC disc. Return to see me in one month. He does have some distal radioulnar degenerative changes as well.

## 2015-06-25 ENCOUNTER — Encounter: Payer: Self-pay | Admitting: Sports Medicine

## 2015-06-26 ENCOUNTER — Encounter: Payer: Self-pay | Admitting: Sports Medicine

## 2015-06-26 DIAGNOSIS — M24139 Other articular cartilage disorders, unspecified wrist: Secondary | ICD-10-CM

## 2015-07-22 ENCOUNTER — Ambulatory Visit: Payer: BC Managed Care – PPO | Admitting: Sports Medicine

## 2015-07-31 ENCOUNTER — Encounter: Payer: Self-pay | Admitting: Sports Medicine

## 2015-08-07 ENCOUNTER — Encounter: Payer: Self-pay | Admitting: Sports Medicine

## 2015-08-13 ENCOUNTER — Ambulatory Visit (INDEPENDENT_AMBULATORY_CARE_PROVIDER_SITE_OTHER): Payer: BC Managed Care – PPO | Admitting: Sports Medicine

## 2015-08-13 ENCOUNTER — Encounter: Payer: Self-pay | Admitting: Sports Medicine

## 2015-08-13 DIAGNOSIS — M25512 Pain in left shoulder: Secondary | ICD-10-CM

## 2015-08-13 NOTE — Progress Notes (Signed)
  Subjective:    CC: Left shoulder pain  HPI: For the past few weeks this pleasant 50 year old male has had increasing pain over the top of his left shoulder, worse with reaching across his chest, we didn't acromioclavicular injection about 9 months ago that provided great relief. Pain is severe, persistent.  Past medical history, Surgical history, Family history not pertinant except as noted below, Social history, Allergies, and medications have been entered into the medical record, reviewed, and no changes needed.   Review of Systems: No fevers, chills, night sweats, weight loss, chest pain, or shortness of breath.   Objective:    General: Well Developed, well nourished, and in no acute distress.  Neuro: Alert and oriented x3, extra-ocular muscles intact, sensation grossly intact.  HEENT: Normocephalic, atraumatic, pupils equal round reactive to light, neck supple, no masses, no lymphadenopathy, thyroid nonpalpable.  Skin: Warm and dry, no rashes. Cardiac: Regular rate and rhythm, no murmurs rubs or gallops, no lower extremity edema.  Respiratory: Clear to auscultation bilaterally. Not using accessory muscles, speaking in full sentences. Left Shoulder: Inspection reveals no abnormalities, atrophy or asymmetry. Tender over the acromioclavicular joint with a positive cross arm test ROM is full in all planes. Rotator cuff strength normal throughout. No signs of impingement with negative Neer and Hawkin's tests, empty can. Speeds and Yergason's tests normal. No labral pathology noted with negative Obrien's, negative crank, negative clunk, and good stability. Normal scapular function observed. No painful arc and no drop arm sign. No apprehension sign  Procedure: Real-time Ultrasound Guided Injection of left acromioclavicular joint Device: GE Logiq E  Verbal informed consent obtained.  Time-out conducted.  Noted no overlying erythema, induration, or other signs of local infection.    Skin prepped in a sterile fashion.  Local anesthesia: Topical Ethyl chloride.  With sterile technique and under real time ultrasound guidance:  1/2 mL kenalog 40, 1/2 mL lidocaine injected easily Completed without difficulty  Pain immediately resolved suggesting accurate placement of the medication.  Advised to call if fevers/chills, erythema, induration, drainage, or persistent bleeding.  Images permanently stored and available for review in the ultrasound unit.  Impression: Technically successful ultrasound guided injection.  Impression and Recommendations:    Left shoulder pain Pain is referable to the acromioclavicular joint today, previous injection was 9 months ago. Injection today, return as needed.

## 2015-08-13 NOTE — Assessment & Plan Note (Signed)
Pain is referable to the acromioclavicular joint today, previous injection was 9 months ago. Injection today, return as needed.

## 2015-09-10 ENCOUNTER — Ambulatory Visit: Payer: BC Managed Care – PPO | Admitting: Sports Medicine

## 2015-09-14 ENCOUNTER — Encounter: Payer: Self-pay | Admitting: Sports Medicine

## 2015-09-14 DIAGNOSIS — M25512 Pain in left shoulder: Secondary | ICD-10-CM

## 2015-09-14 DIAGNOSIS — M170 Bilateral primary osteoarthritis of knee: Secondary | ICD-10-CM

## 2015-09-18 ENCOUNTER — Ambulatory Visit (INDEPENDENT_AMBULATORY_CARE_PROVIDER_SITE_OTHER): Payer: BC Managed Care – PPO

## 2015-09-18 ENCOUNTER — Encounter: Payer: Self-pay | Admitting: Sports Medicine

## 2015-09-18 ENCOUNTER — Other Ambulatory Visit: Payer: Self-pay | Admitting: Sports Medicine

## 2015-09-18 DIAGNOSIS — M24131 Other articular cartilage disorders, right wrist: Secondary | ICD-10-CM

## 2015-09-18 DIAGNOSIS — R937 Abnormal findings on diagnostic imaging of other parts of musculoskeletal system: Secondary | ICD-10-CM

## 2015-09-18 DIAGNOSIS — M25512 Pain in left shoulder: Secondary | ICD-10-CM

## 2015-09-18 MED ORDER — SHORT RAGWEED POLLEN EXT 12 AMB A 1-U SL SUBL: SUBLINGUAL_TABLET | SUBLINGUAL | 11 refills | Status: AC

## 2015-09-22 ENCOUNTER — Encounter: Payer: Self-pay | Admitting: Sports Medicine

## 2015-09-22 ENCOUNTER — Ambulatory Visit: Payer: BC Managed Care – PPO | Admitting: Sports Medicine

## 2015-10-18 IMAGING — RF DG MYELOGRAM LUMBAR
11 series · 11 of 11 positions shown · non-contrast
Comparison: MRI 11/25/2013

CLINICAL DATA: Right-sided low back pain with numbness of the right
leg.

[Series 1: cp_standard · 0.18mm/px · 1 of 1 slices shown]
[im 1/1]
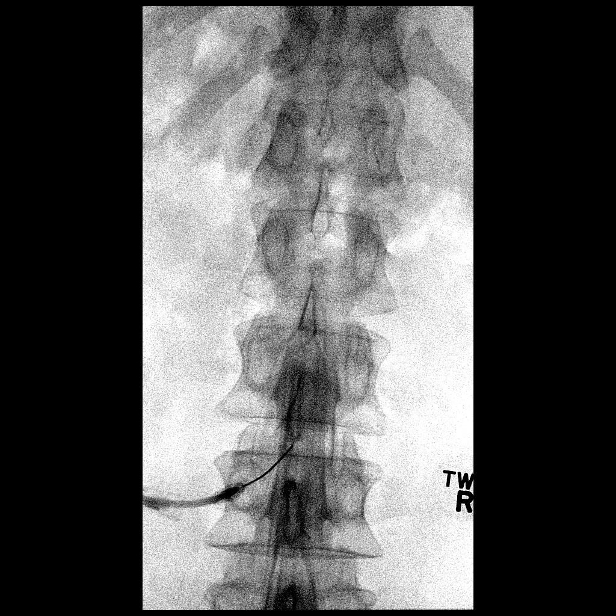

[Series 2: fluoro_myelogram_singleshot_bw · 0.20mm/px · 1 of 1 slices shown (1 of 10)]
[im 1/1]
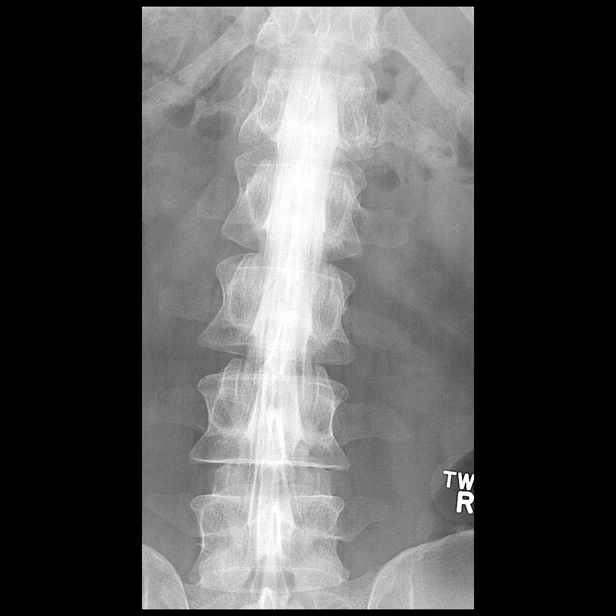

[Series 3: fluoro_myelogram_singleshot_bw · 0.21mm/px · 1 of 1 slices shown (2 of 10)]
[im 1/1]
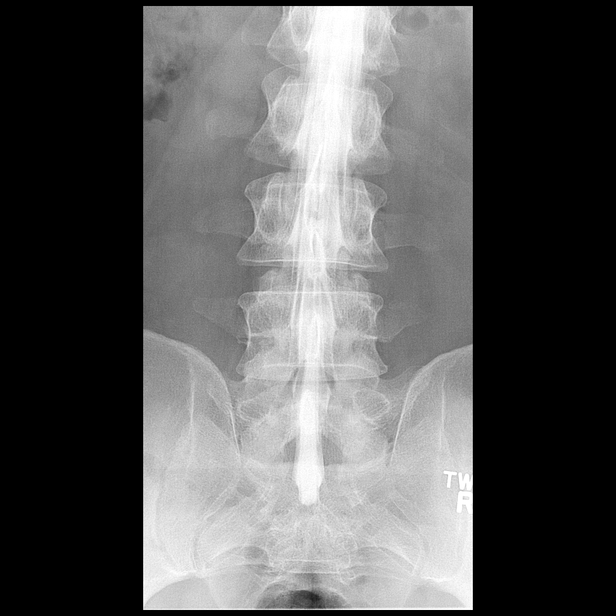

[Series 4: fluoro_myelogram_singleshot_bw · 0.20mm/px · 1 of 1 slices shown (3 of 10)]
[im 1/1]
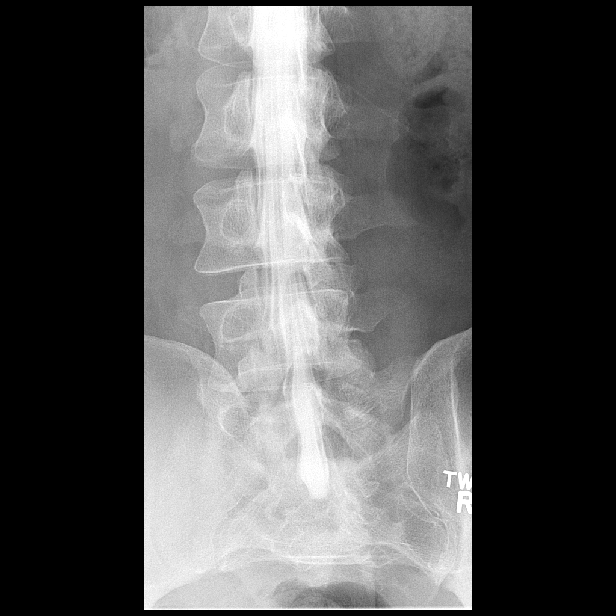

[Series 5: fluoro_myelogram_singleshot_bw · 0.20mm/px · 1 of 1 slices shown (4 of 10)]
[im 1/1]
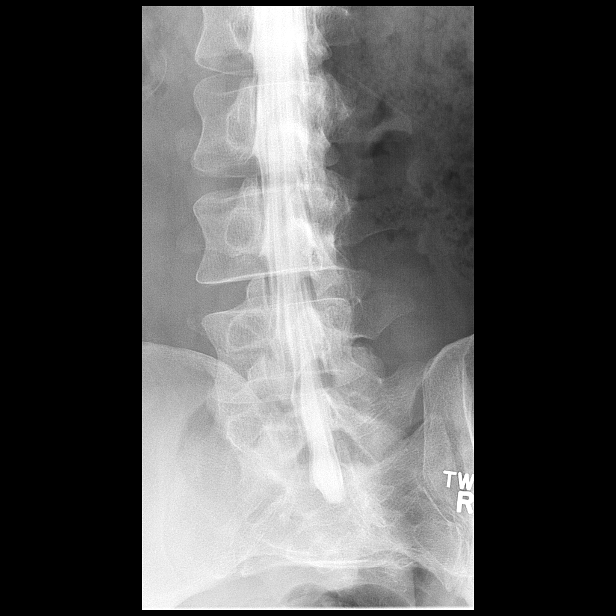

[Series 6: fluoro_myelogram_singleshot_bw · 0.21mm/px · 1 of 1 slices shown (5 of 10)]
[im 1/1]
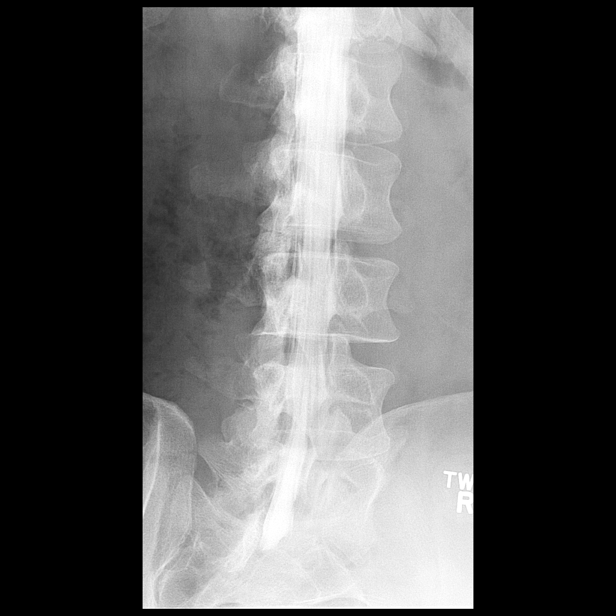

[Series 7: fluoro_myelogram_singleshot_bw · 0.21mm/px · 1 of 1 slices shown (6 of 10)]
[im 1/1]
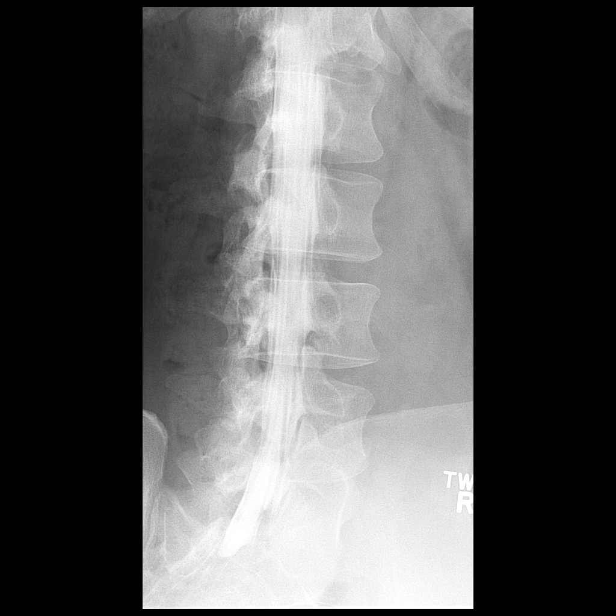

[Series 8: fluoro_myelogram_singleshot_bw · 0.18mm/px · 1 of 1 slices shown (7 of 10)]
[im 1/1]
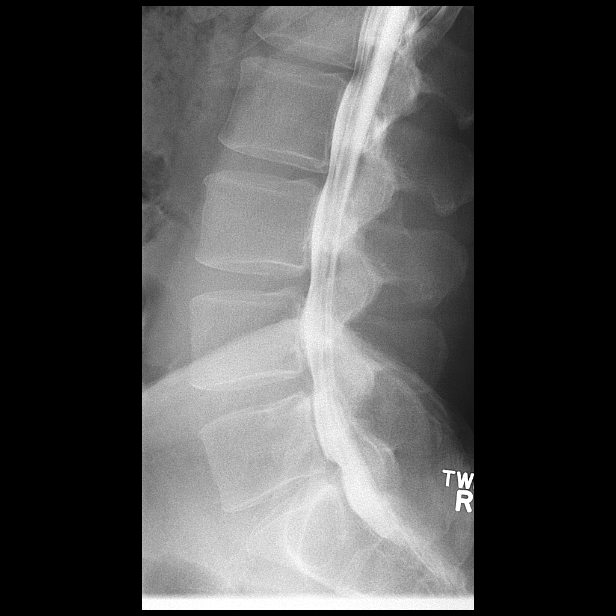

[Series 9: fluoro_myelogram_singleshot_bw · 0.18mm/px · 1 of 1 slices shown (8 of 10)]
[im 1/1]
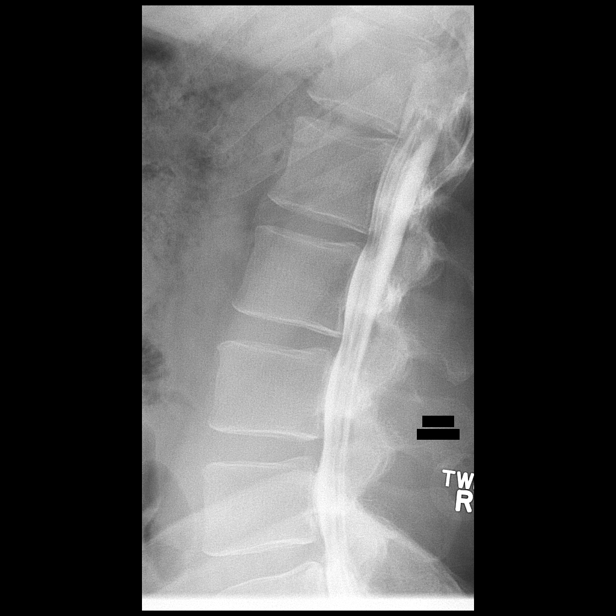

[Series 10: fluoro_myelogram_singleshot_bw · 0.17mm/px · 1 of 1 slices shown (9 of 10)]
[im 1/1]
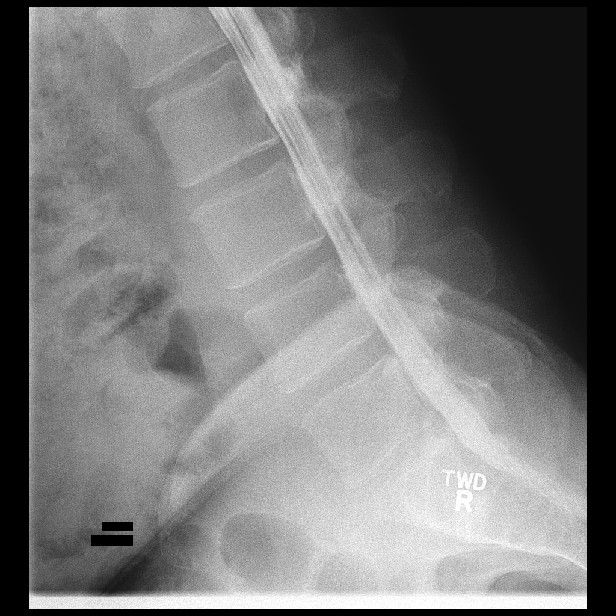

[Series 11: fluoro_myelogram_singleshot_bw · 0.17mm/px · 1 of 1 slices shown (10 of 10)]
[im 1/1]
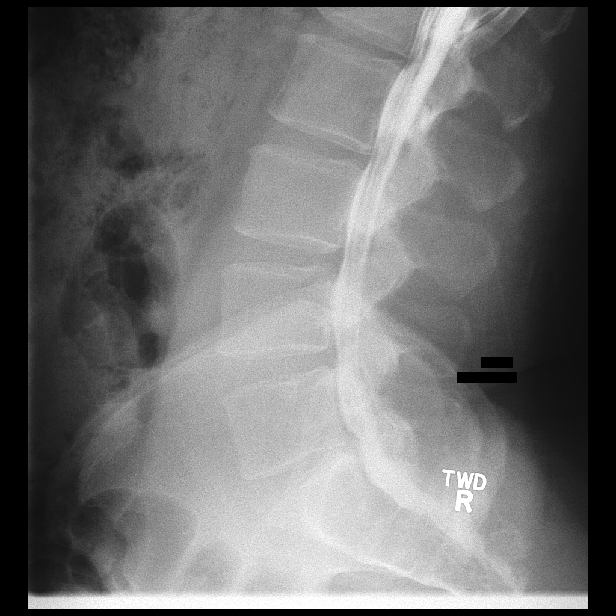

[11 of 11 positions shown; findings below may reference images not displayed]

FLUOROSCOPY TIME:  0 min 48 seconds. [REDACTED]ty-five micro
gray m squared

PROCEDURE:
LUMBAR MYELOGRAM

Lumbar puncture and contrast injection were performed by Dr. Towle.

I personally performed  acquisition of the myelogram images.

CT MYELOGRAM LUMBAR

Procedure: CT imaging of the lumbar spine was performed after
intrathecal contrast administration. Multiplanar CT image
reconstructions were also generated.
FINDINGS: LUMBAR MYELOGRAM FINDINGS:

No central canal stenosis. There is mild bilateral lateral recess
narrowing at L4-5. There would be some potential to affect either or
both L5 nerve roots. Small anterior extradural defects are seen at
L4-5 and L5-S1. There is focal mass effect in the right subarticular
lateral recess at L5-S1 which could compress the right S1 nerve
root. Standing flexion extension views do not show any abnormal
motion, but the anterior extradural defect at L5-S1 is more
prominent with standing.

CT LUMBAR MYELOGRAM FINDINGS:

T12-L1:  Normal interspace.  Conus tip at lower T12.

L1-2:  Normal interspace.

L2-3:  Mild bulging of the disc.  No compressive stenosis.

L3-4: Mild bulging of the disc.  No compressive stenosis.

L4-5: Moderate circumferential bulging of the disc. Congenitally
small canal. Mild facet and ligamentous hypertrophy. Mild stenosis
of the subarticular lateral recesses. There would be some potential
to affect either or both L5 nerve roots, though gross neural
compression is not established. Chronic superior endplate Schmorl's
nodes at L5.

L5-S1: Shallow right posterior lateral disc herniation at L5-S1
contacts the right S1 nerve root and could focally irritate that
structure. This is a small abnormality. There is mild bilateral
facet hypertrophy causing some narrowing of both subarticular
lateral recesses as well.

Incidental note is made of a few small nonobstructing calculi in the
left kidney.
IMPRESSION: Mild bilateral lateral recess stenosis at L4-5. Definite neural
compression is not demonstrated, but the L5 nerve roots would be at
some risk.

Shallow right posterior lateral prominent disc herniation at L5-S1.
This appears to focally compress the right S1 nerve root, though the
findings are somewhat subtle.

## 2017-11-22 ENCOUNTER — Encounter: Payer: Self-pay | Admitting: Physical Therapy

## 2017-11-22 ENCOUNTER — Ambulatory Visit (INDEPENDENT_AMBULATORY_CARE_PROVIDER_SITE_OTHER): Payer: No Typology Code available for payment source | Admitting: Physical Therapy

## 2017-11-22 DIAGNOSIS — G8929 Other chronic pain: Secondary | ICD-10-CM | POA: Diagnosis not present

## 2017-11-22 DIAGNOSIS — M545 Low back pain: Secondary | ICD-10-CM

## 2017-11-22 DIAGNOSIS — R293 Abnormal posture: Secondary | ICD-10-CM | POA: Diagnosis not present

## 2017-11-22 NOTE — Therapy (Signed)
[facility] [address] [place], [place], [address] Phone: [phone]   Fax:  [phone]  Physical Therapy Evaluation  Patient Details  Name: Richard Rios MRN: [id number] Date of Birth: [birth year] Referring Provider (PT): Andee LinemanEnrico Piva   Encounter Date: 11/22/2017  PT End of Session - 11/22/17 1534    Visit Number  1    Number of Visits  12    Date for PT Re-Evaluation  01/03/18    Authorization Type  [place]: approved treatment codes include manual therapy, therapeutic exercise, gait training, neuro re-ed, ultrasound and ionto (both should not exceed 2 weeks or 6 visits)    Authorization - Number of Visits  15    PT Start Time  1430    PT Stop Time  1515    PT Time Calculation (min)  45 min    Activity Tolerance  Patient tolerated treatment well    Behavior During Therapy  [facility] for tasks assessed/performed       History reviewed. No pertinent past medical history.  Past Surgical History:  Procedure Laterality Date  . ORCHIECTOMY Right   . VASECTOMY      There were no vitals filed for this visit.   Subjective Assessment - 11/22/17 1433    Subjective  Pt is a 52 y/o male who presents to OPPT for LBP.  Pt had back surgery about 4 years ago (unable to recall procedure), with exacerbation since June 2019.  Pt presents today with difficulty with bending and ability to exercise.  Recent shoe purchase has     Pertinent History  L5/S1 laminotomy    Limitations  Sitting;Standing;Walking    How long can you sit comfortably?  45 min    How long can you stand comfortably?  2 hours    Diagnostic tests  MRI    Patient Stated Goals  improve pain    Currently in Pain?  Yes    Pain Score  0-No pain   up to 5/10   Pain Location  Back    Pain Orientation  Lower    Pain Descriptors / Indicators  Aching;Sharp;Dull;Sore;Shooting    Pain Type  Acute pain;Chronic pain    Pain Onset  More than a month ago    Pain Frequency   Intermittent    Aggravating Factors   sitting on floor, exercise ([facility])    Pain Relieving Factors  knee to chest         [facility] PT Assessment - 11/22/17 1442      Assessment   Medical Diagnosis  LBP    Referring Provider (PT)  Andee LinemanEnrico Piva    Onset Date/Surgical Date  --   June 2019   Next MD Visit  April 2019    Prior Therapy  none      Precautions   Precautions  None      Restrictions   Weight Bearing Restrictions  No      Balance Screen   Has the patient fallen in the past 6 months  No    Has the patient had a decrease in activity level because of a fear of falling?   No    Is the patient reluctant to leave their home because of a fear of falling?   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; standing most of the day    Leisure  [employer], travel to [employer] football games ([employer])      Cognition   Overall Cognitive Status  Within Functional Limits for tasks assessed      Observation/Other Assessments   Focus on Therapeutic Outcomes (FOTO)   63 (37% limited; predicted 29% limited)      Posture/Postural Control   Posture/Postural Control  Postural limitations    Postural Limitations  Rounded Shoulders;Forward head      ROM / Strength   AROM / PROM / Strength  AROM;Strength      AROM   AROM Assessment Site  Lumbar    Lumbar Flexion  limited 30%    Lumbar Extension  WNL with mild pain    Lumbar - Right Side Bend  WNL    Lumbar - Left Side Bend  WNL    Lumbar - Right Rotation  WNL    Lumbar - Left Rotation  WNL      Strength   Strength Assessment Site  Hip;Knee;Ankle    Right/Left Hip  Right;Left    Right Hip Flexion  5/5    Right Hip  Extension  3+/5    Right Hip ABduction  5/5    Left Hip Flexion  5/5    Left Hip Extension  3+/5    Left Hip ABduction  5/5    Right/Left Knee  Right;Left    Right Knee Flexion  4/5    Right Knee Extension  5/5    Left Knee Flexion  4/5    Left Knee Extension  5/5    Right/Left Ankle  Right;Left    Right Ankle Dorsiflexion  5/5    Left Ankle Dorsiflexion  5/5      Flexibility   Soft Tissue Assessment /Muscle Length  yes    Hamstrings  tightness bil    Piriformis  tightness bil      Palpation   Palpation comment  Pain with gentle palpation over spinous process of L1-4                Objective measurements completed on examination: See above findings.      [facility] Adult PT Treatment/Exercise - 11/22/17 1442      Self-Care   Self-Care  Other Self-Care Comments    Other Self-Care Comments   instructed in HEP: knee to chest 3x30 sec bil; hamstring stretch 3x30 sec bil; piriformis stretch 3x30 sec bil; pelvic tilt 10 x 5 sec              PT Education - 11/22/17 1534    Education Details  HEP    Person(s) Educated  Patient    Methods  Explanation;Demonstration;Handout    Comprehension  Verbalized understanding;Returned demonstration;Need further instruction          PT Long Term Goals - 11/22/17 1548      PT LONG TERM GOAL #1   Title  independent with HEP    Status  New    Target Date  01/03/18      PT LONG TERM GOAL #2   Title  improve lumbar flexion to < 15% limited for improved function    Status  New  Target Date  01/03/18      PT LONG TERM GOAL #3   Title  demonstrate at least 4/5 hip extension strength for improved function    Status  New    Target Date  01/03/18      PT LONG TERM GOAL #4   Title  FOTO improved to </= 30% limited for improved function    Status  New    Target Date  01/03/18             Plan - 11/22/17 1527    Clinical Impression Statement  Pt is a 52 y/o male who presents to OPPT for LBP x 5 months.  Pt  demonstrates mild strength deficits, decreased flexibility and postural abnormalities affecting functional mobility.  Pt will benefit from PT to address deficits listed.    History and Personal Factors relevant to plan of care:  previous back surgery    Clinical Presentation  Stable    Clinical Decision Making  Low    Rehab Potential  Good    PT Frequency  2x / week    PT Duration  6 weeks    PT Treatment/Interventions  ADLs/Self Care Home Management;Cryotherapy;Ultrasound;Moist Heat;Iontophoresis 4mg /ml Dexamethasone;Gait training;Therapeutic exercise;Functional mobility training;Stair training;Patient/family education;Manual techniques;Taping;Dry needling    PT Next Visit Plan  review HEP, continue core/hip ext strengthening, add stretches PRN, modalities PRN (estim not approved)    PT Home Exercise Plan  Access Code: [email or web address]    Consulted and Agree with Plan of Care  Patient       Patient will benefit from skilled therapeutic intervention in order to improve the following deficits and impairments:  Pain, Postural dysfunction, Decreased strength, Impaired flexibility, Decreased range of motion, Decreased activity tolerance, Decreased mobility  Visit Diagnosis: Chronic midline low back pain without sciatica - Plan: PT plan of care cert/re-cert  Abnormal posture - Plan: PT plan of care cert/re-cert     Problem List Patient Active Problem List   Diagnosis Date Noted  . Left shoulder pain 12/19/2014  . External hemorrhoid 09/19/2014  . Degenerative tear of triangular fibrocartilage complex (TFCC) of right wrist 03/20/2014  . Essential hypertension, benign 03/20/2014  . Right lumbar radiculopathy 11/15/2013  . Radiculitis of left cervical region 11/15/2013  . Tinea versicolor 08/13/2013  . Obstructive uropathy 07/15/2013  . Allergic asthma 07/15/2013  . Hyperlipidemia 05/20/2013  . Diabetes mellitus type II, controlled (HCC) 05/20/2013  . Hydrocele 05/13/2013  . Obesity 05/13/2013   . Preventive measure 05/13/2013      Clarita Crane, PT, DPT 11/22/17 3:53 PM     [facility] [address] [place], [place], [address] Phone: [phone]   Fax:  [phone]  Name: Richard Rios MRN: [id number] Date of Birth: 1965-02-06

## 2017-11-22 NOTE — Patient Instructions (Signed)
Access Code: [email or web address]  URL: [email or web address]  Date: 11/22/2017  Prepared by: Moshe CiproStephanie Lametria Klunk   Exercises  Supine Hamstring Stretch with Strap - 3 reps - 1 sets - 30 sec hold - 2x daily - 7x weekly  Supine Piriformis Stretch - 3 reps - 1 sets - 30 sec hold - 2x daily - 7x weekly  Hooklying Single Knee to Chest - 3 reps - 1 sets - 30 sec hold - 2x daily - 7x weekly  Supine Posterior Pelvic Tilt - 10 reps - 1 sets - 5 sec hold - 2x daily - 7x weekly

## 2017-11-27 ENCOUNTER — Encounter: Payer: No Typology Code available for payment source | Admitting: Physical Therapy

## 2017-11-28 ENCOUNTER — Encounter: Payer: Self-pay | Admitting: Physical Therapy

## 2017-11-28 ENCOUNTER — Ambulatory Visit (INDEPENDENT_AMBULATORY_CARE_PROVIDER_SITE_OTHER): Payer: No Typology Code available for payment source | Admitting: Physical Therapy

## 2017-11-28 DIAGNOSIS — G8929 Other chronic pain: Secondary | ICD-10-CM | POA: Diagnosis not present

## 2017-11-28 DIAGNOSIS — R293 Abnormal posture: Secondary | ICD-10-CM | POA: Diagnosis not present

## 2017-11-28 DIAGNOSIS — M545 Low back pain, unspecified: Secondary | ICD-10-CM

## 2017-11-28 NOTE — Therapy (Signed)
[facility] [place] [address] [place], [place], [address] Phone: [phone]   Fax:  [phone]  Physical Therapy Treatment  Patient Details  Name: Richard Rios MRN: [id number] Date of Birth: 1965/11/10 Referring Provider (PT): Andee Lineman   Encounter Date: 11/28/2017  PT End of Session - 11/28/17 1614    Visit Number  2    Number of Visits  12    Date for PT Re-Evaluation  01/03/18    Authorization Type  [place]: approved treatment codes include manual therapy, therapeutic exercise, gait training, neuro re-ed, ultrasound and ionto (both should not exceed 2 weeks or 6 visits)    Authorization - Number of Visits  15    PT Start Time  1533    PT Stop Time  1613    PT Time Calculation (min)  40 min    Activity Tolerance  Patient tolerated treatment well    Behavior During Therapy  [facility] for tasks assessed/performed       History reviewed. No pertinent past medical history.  Past Surgical History:  Procedure Laterality Date  . ORCHIECTOMY Right   . VASECTOMY      There were no vitals filed for this visit.  Subjective Assessment - 11/28/17 1539    Subjective  doing well; back is slowly getting better.    Patient Stated Goals  improve pain    Currently in Pain?  No/denies                       [facility] Adult PT Treatment/Exercise - 11/28/17 1541      Exercises   Exercises  Lumbar      Lumbar Exercises: Stretches   Passive Hamstring Stretch  Right;Left;3 reps;30 seconds    Single Knee to Chest Stretch  Right;Left;3 reps;30 seconds    Press Ups  10 reps    Piriformis Stretch  Right;Left;3 reps;30 seconds      Lumbar Exercises: Aerobic   Recumbent Bike  L5 x 7 min      Lumbar Exercises: Supine   Pelvic Tilt  10 reps;5 seconds      Lumbar Exercises: Quadruped   [place]cat/Old Horse  10 reps                  PT Long Term Goals - 11/22/17 1548      PT LONG TERM GOAL #1   Title   independent with HEP    Status  New    Target Date  01/03/18      PT LONG TERM GOAL #2   Title  improve lumbar flexion to < 15% limited for improved function    Status  New    Target Date  01/03/18      PT LONG TERM GOAL #3   Title  demonstrate at least 4/5 hip extension strength for improved function    Status  New    Target Date  01/03/18      PT LONG TERM GOAL #4   Title  FOTO improved to </= 30% limited for improved function    Status  New    Target Date  01/03/18            Plan - 11/28/17 1614    Clinical Impression Statement  Pt reporting pain overall improving slowly, and is able to tolerate work in previous shoes that initially caused pain without increase in pain.  Continues to demonstrate decreased flexibility.  Progressing well with PT.  Rehab Potential  Good    PT Frequency  2x / week    PT Duration  6 weeks    PT Treatment/Interventions  ADLs/Self Care Home Management;Cryotherapy;Ultrasound;Moist Heat;Iontophoresis 4mg /ml Dexamethasone;Gait training;Therapeutic exercise;Functional mobility training;Stair training;Patient/family education;Manual techniques;Taping;Dry needling    PT Next Visit Plan  continue core/hip ext strengthening, add stretches PRN, modalities PRN (estim not approved)    PT Home Exercise Plan  Access Code: [email or web address]    Consulted and Agree with Plan of Care  Patient       Patient will benefit from skilled therapeutic intervention in order to improve the following deficits and impairments:  Pain, Postural dysfunction, Decreased strength, Impaired flexibility, Decreased range of motion, Decreased activity tolerance, Decreased mobility  Visit Diagnosis: Chronic midline low back pain without sciatica  Abnormal posture     Problem List Patient Active Problem List   Diagnosis Date Noted  . Left shoulder pain 12/19/2014  . External hemorrhoid 09/19/2014  . Degenerative tear of triangular fibrocartilage complex (TFCC) of right wrist  03/20/2014  . Essential hypertension, benign 03/20/2014  . Right lumbar radiculopathy 11/15/2013  . Radiculitis of left cervical region 11/15/2013  . Tinea versicolor 08/13/2013  . Obstructive uropathy 07/15/2013  . Allergic asthma 07/15/2013  . Hyperlipidemia 05/20/2013  . Diabetes mellitus type II, controlled (HCC) 05/20/2013  . Hydrocele 05/13/2013  . Obesity 05/13/2013  . Preventive measure 05/13/2013      Clarita CraneStephanie F Ceasia Elwell, PT, DPT 11/28/17 4:16 PM    [facility] [address] [place], [place], [address] Phone: [phone]   Fax:  [phone]  Name: Richard Rios MRN: [id number] Date of Birth: [birth year]

## 2017-12-04 ENCOUNTER — Ambulatory Visit (INDEPENDENT_AMBULATORY_CARE_PROVIDER_SITE_OTHER): Payer: No Typology Code available for payment source | Admitting: Physical Therapy

## 2017-12-04 ENCOUNTER — Encounter: Payer: Self-pay | Admitting: Physical Therapy

## 2017-12-04 DIAGNOSIS — R293 Abnormal posture: Secondary | ICD-10-CM

## 2017-12-04 DIAGNOSIS — M545 Low back pain: Secondary | ICD-10-CM | POA: Diagnosis not present

## 2017-12-04 DIAGNOSIS — G8929 Other chronic pain: Secondary | ICD-10-CM | POA: Diagnosis not present

## 2017-12-04 NOTE — Therapy (Signed)
[facility] [place] [address] [place] [address] [place], [place], [address] Phone: [phone]   Fax:  [phone]  Physical Therapy Treatment  Patient Details  Name: Unknown Schleyer MRN: [id number] Date of Birth: 07/06/1965 Referring Provider (PT): Andee Lineman   Encounter Date: 12/04/2017  PT End of Session - 12/04/17 1527    Visit Number  3    Number of Visits  12    Date for PT Re-Evaluation  01/03/18    Authorization Type  [place]: approved treatment codes include manual therapy, therapeutic exercise, gait training, neuro re-ed, ultrasound and ionto (both should not exceed 2 weeks or 6 visits)    Authorization - Number of Visits  15    PT Start Time  1445    PT Stop Time  1525    PT Time Calculation (min)  40 min    Activity Tolerance  Patient tolerated treatment well    Behavior During Therapy  [facility] for tasks assessed/performed       History reviewed. No pertinent past medical history.  Past Surgical History:  Procedure Laterality Date  . ORCHIECTOMY Right   . VASECTOMY      There were no vitals filed for this visit.  Subjective Assessment - 12/04/17 1449    Subjective  lying on stomach is very painful    Patient Stated Goals  improve pain    Currently in Pain?  No/denies    Pain Score  0-No pain                       [facility] Adult PT Treatment/Exercise - 12/04/17 1450      Exercises   Exercises  Lumbar      Lumbar Exercises: Stretches   Single Knee to Chest Stretch  Right;Left;2 reps;30 seconds    Piriformis Stretch  Right;Left;2 reps;30 seconds      Lumbar Exercises: Aerobic   Recumbent Bike  L5 x 7 min      Lumbar Exercises: Standing   Scapular Retraction  Both;15 reps;Theraband    Theraband Level (Scapular Retraction)  Level 4 (Blue)    Shoulder Extension  Both;15 reps;Theraband    Theraband Level (Shoulder Extension)  Level 4 (Blue)      Lumbar Exercises: Supine   Pelvic Tilt  10 reps;5 seconds    Clam  10 reps   alternating; cues for core stability   Heel Slides  10 reps   alternating with cues for core stability   Bent Knee Raise  10 reps   alternating with cues for core stability   Bridge  10 reps;5 seconds   with strap                 PT Long Term Goals - 11/22/17 1548      PT LONG TERM GOAL #1   Title  independent with HEP    Status  New    Target Date  01/03/18      PT LONG TERM GOAL #2   Title  improve lumbar flexion to < 15% limited for improved function    Status  New    Target Date  01/03/18      PT LONG TERM GOAL #3   Title  demonstrate at least 4/5 hip extension strength for improved function    Status  New    Target Date  01/03/18      PT LONG TERM GOAL #4   Title  FOTO improved to </= 30%  limited for improved function    Status  New    Target Date  01/03/18            Plan - 12/04/17 1527    Clinical Impression Statement  Pt tolerated session well today reporting improvement in pain overall.  Will continue to benefit from PT to maximize function and decrease pain.  Able to progress core stability today needing mod cues for technique.    Rehab Potential  Good    PT Frequency  2x / week    PT Duration  6 weeks    PT Treatment/Interventions  ADLs/Self Care Home Management;Cryotherapy;Ultrasound;Moist Heat;Iontophoresis 4mg /ml Dexamethasone;Gait training;Therapeutic exercise;Functional mobility training;Stair training;Patient/family education;Manual techniques;Taping;Dry needling    PT Next Visit Plan  continue core/hip ext strengthening, add stretches PRN, modalities PRN (estim not approved)    PT Home Exercise Plan  Access Code: [email or web address]    Consulted and Agree with Plan of Care  Patient       Patient will benefit from skilled therapeutic intervention in order to improve the following deficits and impairments:  Pain, Postural dysfunction, Decreased strength, Impaired flexibility, Decreased range of motion, Decreased activity tolerance,  Decreased mobility  Visit Diagnosis: Chronic midline low back pain without sciatica  Abnormal posture     Problem List Patient Active Problem List   Diagnosis Date Noted  . Left shoulder pain 12/19/2014  . External hemorrhoid 09/19/2014  . Degenerative tear of triangular fibrocartilage complex (TFCC) of right wrist 03/20/2014  . Essential hypertension, benign 03/20/2014  . Right lumbar radiculopathy 11/15/2013  . Radiculitis of left cervical region 11/15/2013  . Tinea versicolor 08/13/2013  . Obstructive uropathy 07/15/2013  . Allergic asthma 07/15/2013  . Hyperlipidemia 05/20/2013  . Diabetes mellitus type II, controlled (HCC) 05/20/2013  . Hydrocele 05/13/2013  . Obesity 05/13/2013  . Preventive measure 05/13/2013      Clarita CraneStephanie F Lilliemae Fruge, PT, DPT 12/04/17 3:30 PM      [facility] [address] [place] [address] [place], [place], [address] Phone: [phone]   Fax:  [phone]  Name: Willette ClusterRoosevelt Graley MRN: [id number] Date of Birth: 1965/05/14

## 2017-12-06 ENCOUNTER — Ambulatory Visit (INDEPENDENT_AMBULATORY_CARE_PROVIDER_SITE_OTHER): Payer: No Typology Code available for payment source | Admitting: Physical Therapy

## 2017-12-06 ENCOUNTER — Encounter: Payer: Self-pay | Admitting: Physical Therapy

## 2017-12-06 DIAGNOSIS — M545 Low back pain: Secondary | ICD-10-CM | POA: Diagnosis not present

## 2017-12-06 DIAGNOSIS — G8929 Other chronic pain: Secondary | ICD-10-CM

## 2017-12-06 DIAGNOSIS — R293 Abnormal posture: Secondary | ICD-10-CM

## 2017-12-06 NOTE — Therapy (Signed)
[facility] [address] [place] [address] [place], [place], [address] Phone: [phone]   Fax:  [phone]  Physical Therapy Treatment  Patient Details  Name: Richard ClusterRoosevelt Coyne MRN: [id number] Date of Birth: [birth year] Referring Provider (PT): Andee LinemanEnrico Piva   Encounter Date: 12/06/2017  PT End of Session - 12/06/17 1509    Visit Number  3    Number of Visits  12    Date for PT Re-Evaluation  01/03/18    Authorization Type  [place]: approved treatment codes include manual therapy, therapeutic exercise, gait training, neuro re-ed, ultrasound and ionto (both should not exceed 2 weeks or 6 visits)    Authorization - Number of Visits  15    PT Start Time  1430    PT Stop Time  1511    PT Time Calculation (min)  41 min    Activity Tolerance  Patient tolerated treatment well    Behavior During Therapy  [facility] for tasks assessed/performed       History reviewed. No pertinent past medical history.  Past Surgical History:  Procedure Laterality Date  . ORCHIECTOMY Right   . VASECTOMY      There were no vitals filed for this visit.  Subjective Assessment - 12/06/17 1432    Subjective  back feels pretty good "right now."  had pain with heel slide and TrA yesterday    Patient Stated Goals  improve pain    Currently in Pain?  No/denies                       [facility] - 12/06/17 1435      Exercises   Exercises  Lumbar      Lumbar Exercises: Stretches   Passive Hamstring Stretch  Right;Left;3 reps;30 seconds    Passive Hamstring Stretch Limitations  knee straight and happy baby      Lumbar Exercises: Aerobic   Recumbent Bike  L5 x 7 min      Lumbar Exercises: Supine   Pelvic Tilt  10 reps;5 seconds    Clam  10 reps   alternating; cues for core stability   Heel Slides  10 reps   alternating with cues for core stability   Bent Knee Raise  10 reps   alternating with cues for core stability   Bridge   10 reps;5 seconds   with strap   Isometric Hip Flexion  10 reps;5 seconds      Lumbar Exercises: Sidelying   Clam  Both;15 reps                  PT Long Term Goals - 11/22/17 1548      PT LONG TERM GOAL #1   Title  independent with HEP    Status  New    Target Date  01/03/18      PT LONG TERM GOAL #2   Title  improve lumbar flexion to < 15% limited for improved function    Status  New    Target Date  01/03/18      PT LONG TERM GOAL #3   Title  demonstrate at least 4/5 hip extension strength for improved function    Status  New    Target Date  01/03/18      PT LONG TERM GOAL #4   Title  FOTO improved to </= 30% limited for improved function    Status  New    Target Date  01/03/18  Plan - 12/06/17 1510    Clinical Impression Statement  Pt with slight increase in pain with TrA exercises, which improved with cues to maintain core activation and decrease heel slide distance.  Overall progressing well with PT reporting improved activity and decreased pain.    Rehab Potential  Good    PT Frequency  2x / week    PT Duration  6 weeks    PT Treatment/Interventions  ADLs/Self Care Home Management;Cryotherapy;Ultrasound;Moist Heat;Iontophoresis 4mg /ml Dexamethasone;Gait training;Therapeutic exercise;Functional mobility training;Stair training;Patient/family education;Manual techniques;Taping;Dry needling    PT Next Visit Plan  continue core/hip ext strengthening, add stretches PRN, modalities PRN (estim not approved)    PT Home Exercise Plan  Access Code: [email or web address]    Consulted and Agree with Plan of Care  Patient       Patient will benefit from skilled therapeutic intervention in order to improve the following deficits and impairments:  Pain, Postural dysfunction, Decreased strength, Impaired flexibility, Decreased range of motion, Decreased activity tolerance, Decreased mobility  Visit Diagnosis: Chronic midline low back pain without sciatica  Abnormal  posture     Problem List Patient Active Problem List   Diagnosis Date Noted  . Left shoulder pain 12/19/2014  . External hemorrhoid 09/19/2014  . Degenerative tear of triangular fibrocartilage complex (TFCC) of right wrist 03/20/2014  . Essential hypertension, benign 03/20/2014  . Right lumbar radiculopathy 11/15/2013  . Radiculitis of left cervical region 11/15/2013  . Tinea versicolor 08/13/2013  . Obstructive uropathy 07/15/2013  . Allergic asthma 07/15/2013  . Hyperlipidemia 05/20/2013  . Diabetes mellitus type II, controlled (HCC) 05/20/2013  . Hydrocele 05/13/2013  . Obesity 05/13/2013  . Preventive measure 05/13/2013      Clarita Crane, PT, DPT 12/06/17 3:12 PM    [facility] [address] [place] [address] [place], [place], [address] Phone: [phone]   Fax:  [phone]  Name: Joab Carden MRN: [id number] Date of Birth: Oct 05, 1965

## 2017-12-11 ENCOUNTER — Encounter: Payer: No Typology Code available for payment source | Admitting: Physical Therapy

## 2017-12-13 ENCOUNTER — Encounter: Payer: No Typology Code available for payment source | Admitting: Physical Therapy

## 2017-12-21 ENCOUNTER — Encounter: Payer: Self-pay | Admitting: Physical Therapy

## 2017-12-25 ENCOUNTER — Encounter: Payer: Self-pay | Admitting: Physical Therapy

## 2017-12-25 ENCOUNTER — Ambulatory Visit (INDEPENDENT_AMBULATORY_CARE_PROVIDER_SITE_OTHER): Payer: Non-veteran care | Admitting: Physical Therapy

## 2017-12-25 DIAGNOSIS — G8929 Other chronic pain: Secondary | ICD-10-CM

## 2017-12-25 DIAGNOSIS — R293 Abnormal posture: Secondary | ICD-10-CM | POA: Diagnosis not present

## 2017-12-25 DIAGNOSIS — M545 Low back pain: Secondary | ICD-10-CM | POA: Diagnosis not present

## 2017-12-25 NOTE — Therapy (Addendum)
[place], [place], [address] Phone: [phone]   Fax:  [phone]  Physical Therapy Treatment/Discharge  Patient Details  Name: Parker Wherley MRN: [id number] Date of Birth: 10-Dec-1965 Referring Provider (PT): Alcus Dad   Encounter Date: 12/25/2017  PT End of Session - 12/25/17 1555    Visit Number  5   updated to reflect missed week   Number of Visits  12    Date for PT Re-Evaluation  01/03/18    Authorization Type  [place]: approved treatment codes include manual therapy, therapeutic exercise, gait training, neuro re-ed, ultrasound and ionto (both should not exceed 2 weeks or 6 visits)    Authorization - Number of Visits  15    PT Start Time  9937    PT Stop Time  1556    PT Time Calculation (min)  40 min    Activity Tolerance  Patient tolerated treatment well    Behavior During Therapy  [facility] for tasks assessed/performed       History reviewed. No pertinent past medical history.  Past Surgical History:  Procedure Laterality Date  . ORCHIECTOMY Right   . VASECTOMY      There were no vitals filed for this visit.  Subjective Assessment - 12/25/17 1528    Subjective  back has been doing well; reports some stiffness today. "it must be the weather."    Patient Stated Goals  improve pain    Currently in Pain?  Yes    Pain Score  6     Pain Orientation  Lower    Pain Descriptors / Indicators  Aching;Sharp;Dull;Sore;Shooting    Pain Type  Acute pain;Chronic pain    Pain Onset  More than a month ago    Pain Frequency  Intermittent    Aggravating Factors   sitting on floor, exercise ([facility])    Pain Relieving Factors  knee to chest         [facility] PT Assessment - 12/25/17 1531      Assessment   Medical Diagnosis  LBP    Referring Provider (PT)  Alcus Dad      Observation/Other Assessments   Focus on Therapeutic Outcomes (FOTO)   83 (17% limited)                    [facility] Adult PT Treatment/Exercise - 12/25/17 1531      Self-Care   Self-Care  Other Self-Care Comments    Other Self-Care Comments   discussed current POC and progress, FOTO score.  Plan to d/c next week if pt continues to do well.      Lumbar Exercises: Stretches   Single Knee to Chest Stretch  Right;Left;2 reps;30 seconds    Piriformis Stretch  Right;Left;2 reps;30 seconds    Other Lumbar Stretch Exercise  childs pose 3x15 sec      Lumbar Exercises: Aerobic   Nustep  L5 x 7 min      Lumbar Exercises: Supine   Pelvic Tilt  10 reps;5 seconds      Lumbar Exercises: Quadruped   Straight Leg Raise  10 reps;3 seconds    Opposite Arm/Leg Raise  Right arm/Left leg;Left arm/Right leg;10 reps                  PT Long Term Goals - 12/25/17 1556      PT LONG TERM GOAL #1   Title  independent with HEP    Status  On-going    Target Date  01/10/18      PT LONG TERM GOAL #2   Title  improve lumbar flexion to < 15% limited for improved function    Status  On-going    Target Date  01/10/18      PT LONG TERM GOAL #3   Title  demonstrate at least 4/5 hip extension strength for improved function    Status  On-going    Target Date  01/10/18      PT LONG TERM GOAL #4   Title  FOTO improved to </= 30% limited for improved function    Status  Achieved            Plan - 12/25/17 1556    Clinical Impression Statement  Pt has met FOTO goal and reports decreased pain following session today.  Overall progressing well with decreased pain and increased function, with positive response to exercise. Plan to update HEP and likely d/c next visit.    Rehab Potential  Good    PT Frequency  2x / week    PT Duration  6 weeks    PT Treatment/Interventions  ADLs/Self Care Home Management;Cryotherapy;Ultrasound;Moist Heat;Iontophoresis 70m/ml Dexamethasone;Gait training;Therapeutic exercise;Functional mobility training;Stair training;Patient/family  education;Manual techniques;Taping;Dry needling    PT Next Visit Plan  continue core/hip ext strengthening, add stretches PRN, modalities PRN (estim not approved)    PT Home Exercise Plan  Access Code: [email or web address]   Consulted and Agree with Plan of Care  Patient       Patient will benefit from skilled therapeutic intervention in order to improve the following deficits and impairments:  Pain, Postural dysfunction, Decreased strength, Impaired flexibility, Decreased range of motion, Decreased activity tolerance, Decreased mobility  Visit Diagnosis: Chronic midline low back pain without sciatica  Abnormal posture     Problem List Patient Active Problem List   Diagnosis Date Noted  . Left shoulder pain 12/19/2014  . External hemorrhoid 09/19/2014  . Degenerative tear of triangular fibrocartilage complex (TFCC) of right wrist 03/20/2014  . Essential hypertension, benign 03/20/2014  . Right lumbar radiculopathy 11/15/2013  . Radiculitis of left cervical region 11/15/2013  . Tinea versicolor 08/13/2013  . Obstructive uropathy 07/15/2013  . Allergic asthma 07/15/2013  . Hyperlipidemia 05/20/2013  . Diabetes [place] type II, controlled ([place] 05/20/2013  . Hydrocele 05/13/2013  . Obesity 05/13/2013  . Preventive measure 05/13/2013      SLaureen Abrahams PT, DPT 12/25/17 3:58 PM     [facility] [address]Phone: [phone]  Fax:  [phone] Name: RPrecious GilchrestMRN: [id number] of Birth: 104-09-1965     PHYSICAL THERAPY DISCHARGE SUMMARY  Visits from Start of Care: 5  Current functional level related to goals / functional outcomes: See above   Remaining deficits: See above   Education / Equipment: HEP  Plan: Patient agrees to discharge.  Patient goals were partially met. Patient is being discharged due to not returning since the last visit.  ?????      SLaureen Abrahams PT, DPT 01/25/18 2:03 PM  [facility] [facility] [place] [address] [phone](office) [phone](fax)

## 2018-01-04 ENCOUNTER — Encounter: Payer: No Typology Code available for payment source | Admitting: Physical Therapy

## 2018-04-24 ENCOUNTER — Encounter: Payer: Self-pay | Admitting: Physical Therapy

## 2018-04-26 ENCOUNTER — Telehealth: Payer: Self-pay | Admitting: Sports Medicine

## 2018-04-26 NOTE — Telephone Encounter (Signed)
Pt called. For some reason he thinks you have dropped him..I don't see anything in your notes to reflect this nor did I get a pop-up to say he was dismissed.  He said he had spoken to you last summer about getting back in with you.. I am confused..maybe he knows something I don't.  Thanks.

## 2018-04-26 NOTE — Telephone Encounter (Signed)
I will relay message to patient. I will also let Victorino Dike know that this patient has been dismissed so that a pop up will be seen in the future.  Thank you.

## 2018-04-26 NOTE — Telephone Encounter (Signed)
There is a dismissal letter in Pt's chart from Sept 2017. He was dismissed based on no shows.  08/12/2013 06/19/2014  10/20/2014 02/17/2015 07/22/2015 09/22/2015

## 2018-04-26 NOTE — Telephone Encounter (Signed)
Okay good.  Thank you.

## 2018-04-26 NOTE — Telephone Encounter (Signed)
I have removed Dr T from Pt's care team. I also see where he has established with [facility] for care.

## 2019-02-08 ENCOUNTER — Ambulatory Visit (INDEPENDENT_AMBULATORY_CARE_PROVIDER_SITE_OTHER): Payer: No Typology Code available for payment source | Admitting: Rehabilitative and Restorative Service Providers"

## 2019-02-08 DIAGNOSIS — M6281 Muscle weakness (generalized): Secondary | ICD-10-CM

## 2019-02-08 DIAGNOSIS — R2689 Other abnormalities of gait and mobility: Secondary | ICD-10-CM | POA: Diagnosis not present

## 2019-02-08 DIAGNOSIS — R29898 Other symptoms and signs involving the musculoskeletal system: Secondary | ICD-10-CM | POA: Diagnosis not present

## 2019-02-08 DIAGNOSIS — M25551 Pain in right hip: Secondary | ICD-10-CM | POA: Diagnosis not present

## 2019-02-08 NOTE — Therapy (Signed)
[facility] [place] [address] [place], [place], [address] Phone: [phone]   Fax:  [phone]  Physical Therapy Evaluation  Patient Details  Name: Richard Rios MRN: [id number] Date of Birth: [birth year] Referring Provider (PT): Andee Lineman, DO   Encounter Date: 02/08/2019  PT End of Session - 02/08/19 1103    Visit Number  1    Number of Visits  12    Date for PT Re-Evaluation  03/22/19    Authorization Type  VA authorized 12 visits post op    PT Start Time  1014    PT Stop Time  1058    PT Time Calculation (min)  44 min    Equipment Utilized During Treatment  Gait belt    Activity Tolerance  Patient tolerated treatment well    Behavior During Therapy  [facility] for tasks assessed/performed       No past medical history on file.  Past Surgical History:  Procedure Laterality Date  . ORCHIECTOMY Right   . VASECTOMY      There were no vitals filed for this visit.   Subjective Assessment - 02/08/19 1019    Subjective  The patient is s/p hip surgery for avascular necrosis on 02/06/2019.  He underwent posterior THR @ [facility].  He is WBAT.    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  1- fell on 12/31 due to blood pressure drop    Has the patient had a decrease in activity level because of a fear of falling?   No    Is the patient reluctant to leave their home because of a fear of falling?   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      Observation/Other Assessments   Focus on Therapeutic Outcomes (FOTO)   27% (73% limited)      Observation/Other Assessments-Edema    Edema  --   patient reports edema     Sensation   Light Touch  Appears Intact      Posture/Postural Control   Posture/Postural Control  No significant limitations      ROM / Strength   AROM / PROM / Strength  AROM;Strength      AROM   Overall AROM Comments  Patient is able to perform heel slide, hip abduction supine, and isolated hip flexion to lift leg onto mat.      Strength   Strength Assessment Site  Hip;Knee;Ankle    Right/Left Hip  Right;Left    Right Hip Flexion  3/5    Right Hip ABduction  2/5    Left Hip Flexion  5/5    Right/Left Knee  Right;Left    Left Knee Flexion  5/5    Left Knee Extension  5/5      Bed Mobility   Bed Mobility  Supine to Sit;Sit to Supine    Supine to Sit  Supervision/Verbal cueing    Sit to Supine  Supervision/Verbal cueing   uses R UE to lift leg into bed     Transfers   Transfers  Sit to Stand;Stand to Sit    Sit to Stand  6: Modified independent  (Device/Increase time)    Stand to Sit  6: Modified independent (Device/Increase time)      Ambulation/Gait   Ambulation/Gait  Yes    Ambulation/Gait Assistance  6: Modified independent (Device/Increase time)    Ambulation Distance (Feet)  75 Feet    Assistive device  Rolling walker    Gait Pattern  Decreased stride length;Decreased stance time - right;Decreased step length - left;Step-to pattern;Decreased weight shift to right    Gait velocity  0.49 ft/sec     Stairs  Yes    Stairs Assistance  5: Supervision    Stair Management Technique  Step to pattern    Number of Stairs  4                Objective measurements completed on examination: See above findings.      [facility] Adult PT Treatment/Exercise - 02/08/19 1024      Exercises   Exercises  Knee/Hip;Ankle      Knee/Hip Exercises: Seated   Sit to Sand  5 reps;with UE support      Knee/Hip Exercises: Supine   Quad Sets  10 reps    Heel Slides  Right;10 reps    Bridges  Strengthening;Both;5 reps    Other Supine Knee/Hip Exercises  hip abduction x 10 reps assisted for motion    Other Supine Knee/Hip Exercises  glut set x 10 reps             PT Education - 02/08/19 1046    Education Details  HEP:  initiated    Person(s) Educated  Patient    Methods  Explanation;Demonstration;Handout    Comprehension  Verbalized understanding;Returned demonstration          PT Long Term Goals - 02/08/19 1336      PT LONG TERM GOAL #1   Title  The patient will be independent with HEP    Time  6    Period  Weeks  Target Date  03/22/19      PT LONG TERM GOAL #2   Title  The patient will report functional limitation < or equal to 45%    Baseline  73% limited    Time  6    Period  Weeks    Target Date  03/22/19      PT LONG TERM GOAL #3   Title  The patient will ambulate without assistive device x limited community distances independently.    Time  6    Period  Weeks    Target Date  03/22/19      PT LONG  TERM GOAL #4   Title  The patient will negotiate 5 steps x 5 reps with one HR and reciprocal pattern mod indep.    Time  6    Period  Weeks    Target Date  03/22/19      PT LONG TERM GOAL #5   Title  The patient will improve gait speed to > or equal to 2.6 ft/sec to demo return to full community ambulation.    Baseline  0.49 ft/sec    Time  6    Period  Weeks    Target Date  03/22/19             Plan - 02/08/19 1047    Clinical Impression Statement  The patient is a 54 yo male presenting to OP physical therapy s/p R THR posterior approach.  He presents with impairments in ROM, strength, pain, swelling limiting functional activities of walking, ADLs performance, IADL performance, and work related activities.  PT to address deficits to promote return to prior level of function.    Personal Factors and Comorbidities  Comorbidity 1;Comorbidity 2    Comorbidities  HTN, diabetes    Examination-Activity Limitations  Locomotion Level;Squat;Stairs;Sit    Stability/Clinical Decision Making  Stable/Uncomplicated    Clinical Decision Making  Low    Rehab Potential  Good    PT Frequency  2x / week    PT Duration  6 weeks    PT Treatment/Interventions  ADLs/Self Care Home Management;Gait training;Stair training;Functional mobility training;Therapeutic activities;Therapeutic exercise;Balance training;Neuromuscular re-education;Cryotherapy;Electrical Stimulation;Iontophoresis 4mg /ml Dexamethasone;Manual techniques;Patient/family education;Taping    PT Next Visit Plan  Progress R LE strengthening, gait, stairs, ROM and functional loading of R LE    PT Home Exercise Plan  [email or web address]    Consulted and Agree with Plan of Care  Patient;Family member/caregiver    Family Member Consulted  spouse present.       Patient will benefit from skilled therapeutic intervention in order to improve the following deficits and impairments:  Abnormal gait, Decreased balance, Decreased strength, Decreased range of  motion, Pain, Decreased activity tolerance, Impaired flexibility  Visit Diagnosis: Pain in right hip  Muscle weakness (generalized)  Other abnormalities of gait and mobility  Other symptoms and signs involving the musculoskeletal system     Problem List Patient Active Problem List   Diagnosis Date Noted  . Left shoulder pain 12/19/2014  . External hemorrhoid 09/19/2014  . Degenerative tear of triangular fibrocartilage complex (TFCC) of right wrist 03/20/2014  . Essential hypertension, benign 03/20/2014  . Right lumbar radiculopathy 11/15/2013  . Radiculitis of left cervical region 11/15/2013  . Tinea versicolor 08/13/2013  . Obstructive uropathy 07/15/2013  . Allergic asthma 07/15/2013  . Hyperlipidemia 05/20/2013  . Diabetes mellitus type II, controlled (HCC) 05/20/2013  . Hydrocele 05/13/2013  . Obesity 05/13/2013  . Preventive measure 05/13/2013    Richard Rios, PT  02/08/2019, 1:50 PM  [facility] [address] [place], [place], [address] Phone: [phone]   Fax:  [phone]  Name: Richard Rios MRN: [id number] Date of Birth: 02-28-1965

## 2019-02-08 NOTE — Patient Instructions (Signed)
Access Code: [email or web address]  URL: [email or web address]  Date: 02/08/2019  Prepared by: Margretta Ditty   Exercises Supine Quad Set - 10 reps - 1 sets - 2x daily - 7x weekly Supine Gluteal Sets - 10 reps - 1 sets - 2x daily - 7x weekly Supine Hip Abduction - 10 reps - 1 sets - 2x daily - 7x weekly Supine Heel Slides - 10 reps - 1 sets - 2x daily - 7x weekly Supine Bridge - 10 reps - 1 sets - 2x daily - 7x weekly Straight Leg Raise - 10 reps - 1 sets - 2x daily - 7x weekly Sit to Stand with Armchair - 5 reps - 1 sets - 2x daily - 7x weekly

## 2019-02-12 ENCOUNTER — Encounter: Payer: Self-pay | Admitting: Physical Therapy

## 2019-02-12 ENCOUNTER — Other Ambulatory Visit: Payer: Self-pay

## 2019-02-12 ENCOUNTER — Ambulatory Visit (INDEPENDENT_AMBULATORY_CARE_PROVIDER_SITE_OTHER): Payer: No Typology Code available for payment source | Admitting: Physical Therapy

## 2019-02-12 DIAGNOSIS — M25551 Pain in right hip: Secondary | ICD-10-CM

## 2019-02-12 DIAGNOSIS — R29898 Other symptoms and signs involving the musculoskeletal system: Secondary | ICD-10-CM | POA: Diagnosis not present

## 2019-02-12 DIAGNOSIS — M6281 Muscle weakness (generalized): Secondary | ICD-10-CM | POA: Diagnosis not present

## 2019-02-12 DIAGNOSIS — R2689 Other abnormalities of gait and mobility: Secondary | ICD-10-CM

## 2019-02-12 NOTE — Therapy (Signed)
[facility] [place] [address] [place], [place], [address] Phone: [phone]   Fax:  [phone]  Physical Therapy Treatment  Patient Details  Name: Richard Rios MRN: [id number] Date of Birth: 07/19/1965 Referring Provider (PT): Andee Lineman, DO   Encounter Date: 02/12/2019  PT End of Session - 02/12/19 1256    Visit Number  2    Number of Visits  12    Date for PT Re-Evaluation  03/22/19    Authorization Type  VA authorized 12 visits post op    PT Start Time  1148    PT Stop Time  1231    PT Time Calculation (min)  43 min    Equipment Utilized During Treatment  Gait belt    Activity Tolerance  Patient tolerated treatment well    Behavior During Therapy  [facility] for tasks assessed/performed       History reviewed. No pertinent past medical history.  Past Surgical History:  Procedure Laterality Date  . ORCHIECTOMY Right   . VASECTOMY      There were no vitals filed for this visit.  Subjective Assessment - 02/12/19 1206    Subjective  Pt reports he is "sore" in his Rt ant hip.  The [facility] that was delivered to him is too small (should have received bariatric one), but is unable to exchange it.    Currently in Pain?  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)    Referring Provider (PT)  Andee Lineman, DO    Onset Date/Surgical Date  02/06/19    Hand Dominance  Right    Next MD Visit  02/21/19    Prior Therapy  none       [facility] Adult PT Treatment/Exercise - 02/12/19 0001      Ambulation/Gait   Ambulation/Gait Assistance  6: Modified independent (Device/Increase time)    Ambulation Distance (Feet)  320 Feet    Assistive device  Rolling walker    Gait Pattern  Decreased stance time - right;Decreased weight shift  to right;Step-through pattern    Ambulation Surface  Level;Indoor    Pre-Gait Activities  Rt hip ext with knee flex/ext x 10 (to loosen knee/ hip up)    Gait Comments  Cues to increased Lt step length in order to increase Rt hip ext during toe off; good carry over.       Self-Care   Self-Care  Other Self-Care Comments    Other Self-Care Comments   reviewed posterior hip precautions with pt; pt able to verbalize 3 with cues.       Knee/Hip Exercises: Aerobic   Nustep  L2-4 x 5 min      Knee/Hip Exercises: Standing   Side Lunges Limitations  gentle side to side lunges holding RW x 5     Other Standing Knee Exercises  weight shifts forward / backward with RW x 10 (5 reps onto scale to ck WB- pt WB 50% on RLE) , weight shifts side to side       Knee/Hip Exercises: Seated   Other Seated Knee/Hip Exercises  reviewed sit to/from stand maintiaing hip precautions (simulation for commode with use  of RW x 4 reps; pt encouraged to straighten RLE and lean Lt with hand on tub to push up )   Sit to Sand  10 reps;without UE support   in NuSTep seat, cues for even weight     Knee/Hip Exercises: Supine   Quad Sets  --   1 rep combined with glute set for review of HEP   Heel Slides  --   1 rep for review of HEP.    Bridges  1 set;10 reps    Straight Leg Raises  Right;Strengthening;5 reps    Straight Leg Raises Limitations  cues for quad set prior to lifting LE      Knee/Hip Exercises: Sidelying   Clams  RLE x 10, with pillow between knees       Knee/Hip Exercises: Prone   Other Prone Exercises  prone on elbows x 1 min                   PT Long Term Goals - 02/08/19 1336      PT LONG TERM GOAL #1   Title  The patient will be independent with HEP    Time  6    Period  Weeks    Target Date  03/22/19      PT LONG TERM GOAL #2   Title  The patient will report functional limitation < or equal to 45%    Baseline  73% limited    Time  6    Period  Weeks    Target Date  03/22/19       PT LONG TERM GOAL #3   Title  The patient will ambulate without assistive device x limited community distances independently.    Time  6    Period  Weeks    Target Date  03/22/19      PT LONG TERM GOAL #4   Title  The patient will negotiate 5 steps x 5 reps with one HR and reciprocal pattern mod indep.    Time  6    Period  Weeks    Target Date  03/22/19      PT LONG TERM GOAL #5   Title  The patient will improve gait speed to > or equal to 2.6 ft/sec to demo return to full community ambulation.    Baseline  0.49 ft/sec    Time  6    Period  Weeks    Target Date  03/22/19            Plan - 02/12/19 1259    Clinical Impression Statement  Pt challenged with Rt SLR, able to complete 5 full reps prior to fatigue.  Pt's quality of gait improved with cues to lengthen Lt step length and allow Rt leg to extend during toe off.  Pt reported elimination of hip pain after gait trial.  Pt only able to tolerate ~50% weight into RLE at this time.  Time spent discussion post hip precautions and problem solving getting on/ off commode without BSC (too small).    Personal Factors and Comorbidities  Comorbidity 1;Comorbidity 2    Comorbidities  HTN, diabetes    Examination-Activity Limitations  Locomotion Level;Squat;Stairs;Sit    Stability/Clinical Decision Making  Stable/Uncomplicated    Rehab Potential  Good    PT Frequency  2x / week    PT Duration  6 weeks    PT Treatment/Interventions  ADLs/Self Care Home Management;Gait training;Stair training;Functional mobility training;Therapeutic activities;Therapeutic exercise;Balance training;Neuromuscular re-education;Cryotherapy;Electrical Stimulation;Iontophoresis  4mg /ml Dexamethasone;Manual techniques;Patient/family education;Taping    PT Next Visit Plan  Progress R LE strengthening, gait, stairs, ROM and functional loading of R LE    PT Home Exercise Plan  [email or web address]    Consulted and Agree with Plan of Care  Patient;Family member/caregiver     Family Member Consulted  spouse present.       Patient will benefit from skilled therapeutic intervention in order to improve the following deficits and impairments:  Abnormal gait, Decreased balance, Decreased strength, Decreased range of motion, Pain, Decreased activity tolerance, Impaired flexibility  Visit Diagnosis: Pain in right hip  Muscle weakness (generalized)  Other abnormalities of gait and mobility  Other symptoms and signs involving the musculoskeletal system     Problem List Patient Active Problem List   Diagnosis Date Noted  . Left shoulder pain 12/19/2014  . External hemorrhoid 09/19/2014  . Degenerative tear of triangular fibrocartilage complex (TFCC) of right wrist 03/20/2014  . Essential hypertension, benign 03/20/2014  . Right lumbar radiculopathy 11/15/2013  . Radiculitis of left cervical region 11/15/2013  . Tinea versicolor 08/13/2013  . Obstructive uropathy 07/15/2013  . Allergic asthma 07/15/2013  . Hyperlipidemia 05/20/2013  . Diabetes mellitus type II, controlled ([place]) 05/20/2013  . Hydrocele 05/13/2013  . Obesity 05/13/2013  . Preventive measure 05/13/2013   Kerin Perna, PTA 02/12/19 1:05 PM  [place], [place], [address] Phone: [phone]   Fax:  [phone]  Name: Richard Rios MRN: [id number] Date of Birth: 03-16-1965

## 2019-02-15 ENCOUNTER — Other Ambulatory Visit: Payer: Self-pay

## 2019-02-15 ENCOUNTER — Encounter: Payer: Self-pay | Admitting: Rehabilitative and Restorative Service Providers"

## 2019-02-15 ENCOUNTER — Ambulatory Visit (INDEPENDENT_AMBULATORY_CARE_PROVIDER_SITE_OTHER): Payer: No Typology Code available for payment source | Admitting: Rehabilitative and Restorative Service Providers"

## 2019-02-15 DIAGNOSIS — R2689 Other abnormalities of gait and mobility: Secondary | ICD-10-CM

## 2019-02-15 DIAGNOSIS — M25551 Pain in right hip: Secondary | ICD-10-CM

## 2019-02-15 DIAGNOSIS — M6281 Muscle weakness (generalized): Secondary | ICD-10-CM | POA: Diagnosis not present

## 2019-02-15 DIAGNOSIS — R29898 Other symptoms and signs involving the musculoskeletal system: Secondary | ICD-10-CM | POA: Diagnosis not present

## 2019-02-15 NOTE — Therapy (Signed)
[facility] [place] [address] [place], [place], [address] Phone: [phone]   Fax:  [phone]  Physical Therapy Treatment  Patient Details  Name: Richard Rios MRN: [id number] Date of Birth: October 10, 1965 Referring Provider (PT): Andee Lineman, DO   Encounter Date: 02/15/2019  PT End of Session - 02/15/19 1312    Visit Number  3    Number of Visits  12    Date for PT Re-Evaluation  03/22/19    Authorization Type  VA authorized 12 visits post op    PT Start Time  1104    PT Stop Time  1145    PT Time Calculation (min)  41 min    Equipment Utilized During Treatment  Gait belt    Activity Tolerance  Patient tolerated treatment well    Behavior During Therapy  [facility] for tasks assessed/performed       History reviewed. No pertinent past medical history.  Past Surgical History:  Procedure Laterality Date  . ORCHIECTOMY Right   . VASECTOMY      There were no vitals filed for this visit.  Subjective Assessment - 02/15/19 1107    Subjective  The patient is a little sore today and attributes this to weather.  He notes Jenn at last visit showed him how to position differently for toileting and it is tolerable without BSC.    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  Yes    Pain Score  4     Pain Location  Hip    Pain Orientation  Lateral;Right    Pain Descriptors / Indicators  Sore;Aching    Pain Type  Surgical pain    Pain Onset  1 to 4 weeks ago    Pain Frequency  Intermittent    Aggravating Factors   worse at night    Pain Relieving Factors  medication                       [facility] Adult PT Treatment/Exercise - 02/15/19 1109      Ambulation/Gait   Ambulation/Gait  Yes    Ambulation/Gait Assistance  6: Modified independent (Device/Increase time);4: Min guard    Ambulation Distance (Feet)  150  Feet   x 3 reps   Assistive device  Rolling walker;Straight cane    Gait Pattern  Step-through pattern;Decreased stance time - right    Ambulation Surface  Level;Indoor    Gait velocity  2.05 ft/sec    Gait Comments  patient demonstrating improved stride length and even steps; worked on gait with SPC, however patient demonstrates an antalgic pattern x 75 ft with CGA for safety.      Exercises   Exercises  Knee/Hip;Ankle      Knee/Hip Exercises: Aerobic   Nustep  L4 x 5 minutes LEs only      Knee/Hip Exercises: Standing   Heel Raises  10 reps   UE support   Hip Flexion  Stengthening;Right;Left   10 reps tapping to 4" step with handrails   Hip Abduction  Stengthening;Right;Left;10 reps    Abduction Limitations  using sidestepping with UE support    Forward Step Up  10 reps;Right;Step Height: 4";Hand Hold: 2    Functional Squat  10 reps   mini squat with bilat UE support at counter   Other Standing Knee Exercises  near countertop dec'ing UE support performing lateral weight shifting and ant/posterior weight shifting dec'ing support; emphasizing upright posture and dec'ing compensatory trunk leaning      Knee/Hip Exercises: Supine   Bridges  Strengthening;Both;10 reps    Bridges with [employer]  Strengthening;Both;10 reps    Single Leg Bridge  Right;Strengthening;10 reps    Straight Leg Raises  Right;Strengthening   6 reps   Straight Leg Raises Limitations  cues for quad set prior to lifting      Knee/Hip Exercises: Sidelying   Hip ABduction  Strengthening;Right;10 reps    Hip ABduction Limitations  modified to lift R LE on bolster    Clams  R LE x 10 reps      Knee/Hip Exercises: Prone   Hamstring Curl  10 reps    Hip Extension  Strengthening;Right;5 reps    Other Prone Exercises  Prone quad set x 10 reps                  PT Long Term Goals - 02/08/19 1336      PT LONG TERM GOAL #1   Title  The patient will be independent with HEP    Time  6    Period   Weeks    Target Date  03/22/19      PT LONG TERM GOAL #2   Title  The patient will report functional limitation < or equal to 45%    Baseline  73% limited    Time  6    Period  Weeks    Target Date  03/22/19      PT LONG TERM GOAL #3   Title  The patient will ambulate without assistive device x limited community distances independently.    Time  6    Period  Weeks    Target Date  03/22/19      PT LONG TERM GOAL #4   Title  The patient will negotiate 5 steps x 5 reps with one HR and reciprocal pattern mod indep.    Time  6    Period  Weeks    Target Date  03/22/19      PT LONG TERM GOAL #5   Title  The patient will improve gait speed to > or equal to 2.6 ft/sec to demo return to full community ambulation.    Baseline  0.49 ft/sec    Time  6    Period  Weeks    Target Date  03/22/19            Plan - 02/15/19 1315    Clinical Impression Statement  The patient has improved gait speed to > 2.0 ft/sec from 0.49 ft/sec with RW.  He is improving isolated motor control around R hip now able to perform R hip abduction (from bolster height).  PT to continue working to LTGs progressing LE strengthening and gait activities to tolerance.    Personal Factors and Comorbidities  Comorbidity 1;Comorbidity 2    Comorbidities  HTN, diabetes    Examination-Activity Limitations  Locomotion Level;Squat;Stairs;Sit    Stability/Clinical Decision Making  Stable/Uncomplicated    Rehab Potential  Good    PT Frequency  2x / week    PT Duration  6 weeks    PT Treatment/Interventions  ADLs/Self Care Home Management;Gait training;Stair training;Functional mobility training;Therapeutic activities;Therapeutic exercise;Balance training;Neuromuscular re-education;Cryotherapy;Electrical Stimulation;Iontophoresis 4mg /ml Dexamethasone;Manual techniques;Patient/family education;Taping    PT Next Visit Plan  Progress R LE strengthening, gait, stairs, ROM and functional loading of  [place]    Consulted and Agree with Plan of Care  Patient    Family Member Consulted  spouse present.       Patient will benefit from skilled therapeutic intervention in order to improve the following deficits and impairments:  Abnormal gait, Decreased balance, Decreased strength, Decreased range of motion, Pain, Decreased activity tolerance, Impaired flexibility  Visit Diagnosis: Pain in right hip  Muscle weakness (generalized)  Other abnormalities of gait and mobility  Other symptoms and signs involving the musculoskeletal system     Problem List Patient Active Problem List   Diagnosis Date Noted  . Left shoulder pain 12/19/2014  . External hemorrhoid 09/19/2014  . Degenerative tear of triangular fibrocartilage complex (TFCC) of right wrist 03/20/2014  . Essential hypertension, benign 03/20/2014  . Right lumbar radiculopathy 11/15/2013  . Radiculitis of left cervical region 11/15/2013  . Tinea versicolor 08/13/2013  . Obstructive uropathy 07/15/2013  . Allergic asthma 07/15/2013  . Hyperlipidemia 05/20/2013  . Diabetes mellitus type II, controlled ([place]) 05/20/2013  . Hydrocele 05/13/2013  . Obesity 05/13/2013  . Preventive measure 05/13/2013    Leslie Langille, PT 02/15/2019, 4:48 PM  [facility] [employer] [place], [place], [address] Phone: [phone]   Fax:  [phone]  Name: Richard Rios MRN: [id number] Date of Birth: 06-28-1965

## 2019-02-18 ENCOUNTER — Other Ambulatory Visit: Payer: Self-pay

## 2019-02-18 ENCOUNTER — Ambulatory Visit (INDEPENDENT_AMBULATORY_CARE_PROVIDER_SITE_OTHER): Payer: No Typology Code available for payment source | Admitting: Physical Therapy

## 2019-02-18 DIAGNOSIS — M6281 Muscle weakness (generalized): Secondary | ICD-10-CM | POA: Diagnosis not present

## 2019-02-18 DIAGNOSIS — R2689 Other abnormalities of gait and mobility: Secondary | ICD-10-CM

## 2019-02-18 DIAGNOSIS — M25551 Pain in right hip: Secondary | ICD-10-CM | POA: Diagnosis not present

## 2019-02-18 DIAGNOSIS — R29898 Other symptoms and signs involving the musculoskeletal system: Secondary | ICD-10-CM

## 2019-02-18 NOTE — Therapy (Signed)
[place], [place], [address] Phone: [phone]   Fax:  [phone]  Physical Therapy Treatment  Patient Details  Name: Nishant Schrecengost MRN: [id number] Date of Birth: 1965/10/20 Referring Provider (PT): Alcus Dad, DO   Encounter Date: 02/18/2019  PT End of Session - 02/18/19 1611    Visit Number  4    Number of Visits  12    Date for PT Re-Evaluation  03/22/19    Authorization Type  VA authorized 12 visits post op    PT Start Time  1605    PT Stop Time  1648    PT Time Calculation (min)  43 min    Equipment Utilized During Treatment  Gait belt    Activity Tolerance  Patient tolerated treatment well    Behavior During Therapy  [facility] for tasks assessed/performed       No past medical history on file.  Past Surgical History:  Procedure Laterality Date  . ORCHIECTOMY Right   . VASECTOMY      There were no vitals filed for this visit.  Subjective Assessment - 02/18/19 1621    Subjective  Pt reports the pain in his Rt groin has worsened since last visit.  Otherwise no new changes.    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)    Referring Provider (PT)  Alcus Dad, DO    Onset Date/Surgical Date  02/06/19    Hand Dominance  Right    Next MD Visit  02/21/19    Prior Therapy  none      Flexibility   Soft Tissue Assessment /Muscle Length  yes    Quadriceps  Rt knee 115 deg with prone quad stretch      [facility]  Adult PT Treatment/Exercise - 02/18/19 0001      Ambulation/Gait   Ambulation/Gait Assistance  6: Modified independent (Device/Increase time)    Ambulation Distance (Feet)  160 Feet   additional 160 with RW   Assistive device  Straight cane    Gait Pattern  Step-through pattern;Decreased arm swing - right;Decreased step length - left    Ambulation Surface  Indoor;Level    Pre-Gait Activities  instruction on cane placement and sequence     Gait Comments  cues to bring RLE under him more when walking;  cues to increase Lt step length.       Self-Care   Other Self-Care Comments   pt educated on self massage to RLE (in seated position) with roller stick to decrease pain and fascial tightness; pt returned demo with cues      Knee/Hip Exercises: Stretches   Personal assistant reps;30 seconds   prone, with strap   Other Knee/Hip Stretches  butterfly stretch with RLE resting on PTA x 20  sec x 3 reps     Other Knee/Hip Stretches  standing Rt adductor stretch holding onto counter x 20 sec x 2 reps      Knee/Hip Exercises: Aerobic   Nustep  L5 x 6 minutes LEs only      Knee/Hip Exercises: Standing   Forward Step Up  Right;1 set;5 reps;Hand Hold: 2;Step Height: 6"    Stairs  reciprocal pattern x 10 steps (3-6" stairs with BUE on rails)     Other Standing Knee Exercises  weight shifts onto RLE to SLS x 5-10 sec x 3 reps       Knee/Hip Exercises: Prone   Hamstring Curl  1 set;5 reps    Hip Extension  Strengthening;Right;Left;1 set;5 reps    Other Prone Exercises  prone on elbows x 1 min       Manual Therapy   Manual Therapy  Soft tissue mobilization    Soft tissue mobilization  TPR to Rt adductors with contract/relax                   PT Long Term Goals - 02/08/19 1336      PT LONG TERM GOAL #1   Title  The patient will be independent with HEP    Time  6    Period  Weeks    Target Date  03/22/19      PT LONG TERM GOAL #2   Title  The patient will report functional  limitation < or equal to 45%    Baseline  73% limited    Time  6    Period  Weeks    Target Date  03/22/19      PT LONG TERM GOAL #3   Title  The patient will ambulate without assistive device x limited community distances independently.    Time  6    Period  Weeks    Target Date  03/22/19      PT LONG TERM GOAL #4   Title  The patient will negotiate 5 steps x 5 reps with one HR and reciprocal pattern mod indep.    Time  6    Period  Weeks    Target Date  03/22/19      PT LONG TERM GOAL #5   Title  The patient will improve gait speed to > or equal to 2.6 ft/sec to demo return to full community ambulation.    Baseline  0.49 ft/sec    Time  6    Period  Weeks    Target Date  03/22/19            Plan - 02/18/19 1708    Clinical Impression Statement  Palpable tightness / tenderness in pt's Rt adductors (more prox than distal); improved with self massage, and TPR.  Pt able to tolerate 100% of WB through RLE, able to complete SLS 5-10 sec without UE support.  PRogressing well towards goals.    Personal Factors and Comorbidities  Comorbidity 1;Comorbidity 2    Comorbidities  HTN, diabetes    Examination-Activity Limitations  Locomotion Level;Squat;Stairs;Sit    Stability/Clinical Decision Making  Stable/Uncomplicated    Rehab Potential  Good    PT Frequency  2x / week    PT Duration  6 weeks    PT Treatment/Interventions  ADLs/Self Care Home Management;Gait training;Stair training;Functional mobility training;Therapeutic activities;Therapeutic exercise;Balance training;Neuromuscular re-education;Cryotherapy;Electrical Stimulation;Iontophoresis 4mg /ml Dexamethasone;Manual techniques;Patient/family education;Taping    PT Next Visit Plan  Progress R LE strengthening, gait, stairs,  ROM and functional loading of R LE    PT Home Exercise Plan  [id number]    Consulted and Agree with Plan of Care  Patient       Patient will benefit from skilled therapeutic intervention in order to  improve the following deficits and impairments:  Abnormal gait, Decreased balance, Decreased strength, Decreased range of motion, Pain, Decreased activity tolerance, Impaired flexibility  Visit Diagnosis: Pain in right hip  Muscle weakness (generalized)  Other abnormalities of gait and mobility  Other symptoms and signs involving the musculoskeletal system     Problem List Patient Active Problem List   Diagnosis Date Noted  . Left shoulder pain 12/19/2014  . External hemorrhoid 09/19/2014  . Degenerative tear of triangular fibrocartilage complex (TFCC) of right wrist 03/20/2014  . Essential hypertension, benign 03/20/2014  . Right lumbar radiculopathy 11/15/2013  . Radiculitis of left cervical region 11/15/2013  . Tinea versicolor 08/13/2013  . Obstructive uropathy 07/15/2013  . Allergic asthma 07/15/2013  . Hyperlipidemia 05/20/2013  . Diabetes mellitus type II, controlled (HCC) 05/20/2013  . Hydrocele 05/13/2013  . Obesity 05/13/2013  . Preventive measure 05/13/2013   Mayer Camel, PTA 02/18/19 5:10 PM  [facility] [place] [address] [place], [place], [address] Phone: [phone]   Fax:  [phone]  Name: Geo Slone MRN: [id number] Date of Birth: [birth year]

## 2019-02-22 ENCOUNTER — Other Ambulatory Visit: Payer: Self-pay

## 2019-02-22 ENCOUNTER — Encounter: Payer: Self-pay | Admitting: Rehabilitative and Restorative Service Providers"

## 2019-02-22 ENCOUNTER — Ambulatory Visit (INDEPENDENT_AMBULATORY_CARE_PROVIDER_SITE_OTHER): Payer: No Typology Code available for payment source | Admitting: Physical Therapy

## 2019-02-22 DIAGNOSIS — M6281 Muscle weakness (generalized): Secondary | ICD-10-CM | POA: Diagnosis not present

## 2019-02-22 DIAGNOSIS — M25551 Pain in right hip: Secondary | ICD-10-CM

## 2019-02-22 DIAGNOSIS — R2689 Other abnormalities of gait and mobility: Secondary | ICD-10-CM | POA: Diagnosis not present

## 2019-02-22 NOTE — Therapy (Signed)
[facility] [place] [address] [place], [place], [address] Phone: [phone]   Fax:  [phone]  Physical Therapy Treatment  Patient Details  Name: Richard Rios MRN: [id number] Date of Birth: [birth year] Referring Provider (PT): Andee Lineman, DO   Encounter Date: 02/22/2019  PT End of Session - 02/22/19 1622    Visit Number  5    Number of Visits  12    Date for PT Re-Evaluation  03/22/19    Authorization Type  VA authorized 12 visits post op    PT Start Time  1535    PT Stop Time  1619    PT Time Calculation (min)  44 min    Equipment Utilized During Treatment  Gait belt    Activity Tolerance  Patient tolerated treatment well;No increased pain    Behavior During Therapy  [facility] for tasks assessed/performed       No past medical history on file.  Past Surgical History:  Procedure Laterality Date  . ORCHIECTOMY Right   . VASECTOMY      There were no vitals filed for this visit.  Subjective Assessment - 02/22/19 1545    Subjective  Pt reports he had follow up with PA; pleased with progress and incision is healing nicely.  He was told he has no restrictions.    Currently in Pain?  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)    Referring Provider (PT)  Andee Lineman, DO    Onset Date/Surgical Date  02/06/19    Hand Dominance  Right    Next MD Visit  04/03/19    Prior Therapy  none       [facility] Adult PT Treatment/Exercise - 02/22/19 0001      Ambulation/Gait   Ambulation/Gait Assistance  6: Modified independent (Device/Increase time)    Ambulation Distance (Feet)  160 Feet    Assistive device  Straight cane    Gait Pattern  Step-through pattern;Decreased arm swing - right;Decreased step length - left    Ambulation Surface  Indoor;Unlevel      Self-Care   Self-Care  Scar  Mobilizations    Scar Mobilizations  Pt educated on scar mobilization; pt returned demo with cues.       Knee/Hip Exercises: Stretches   Passive Hamstring Stretch  Right;2 reps;20 seconds    Quad Stretch  Right;2 reps;30 seconds   prone, with strap   Other Knee/Hip Stretches  butterfly stretch with RLE resting on PTA x 20 sec x 2      Knee/Hip Exercises: Aerobic   Nustep  L5 x 5 minutes LEs only      Knee/Hip Exercises: Standing   Lateral Step Up  Right;1 set;10 reps;Hand Hold: 1;Step Height: 6"    Forward Step Up  Right;1 set;10 reps;Hand Hold: 2;Step Height: 6"    Step Down  Left;1 set;10 reps;Hand Hold: 2   3" step   SLS  Rt SLS x 15 sec, without UE support;   Rt SLS with Lt toe taps front side back x 10, intermittent UE to steadhy      Knee/Hip Exercises: Seated   Sit to Sand  15 reps;without UE support   8 reps with hip abdct upon standing, NuStep chair     Knee/Hip Exercises: Supine  Bridges  Strengthening;1 set;10 reps   5 sec hold; strap around thighs for abdct      Knee/Hip Exercises: Sidelying   Hip ABduction  Right;1 set;15 reps    Hip ABduction Limitations  modified to lift R LE on bolster      Manual Therapy   Soft tissue mobilization  TPR to Rt adductors with contract/relax                   PT Long Term Goals - 02/08/19 1336      PT LONG TERM GOAL #1   Title  The patient will be independent with HEP    Time  6    Period  Weeks    Target Date  03/22/19      PT LONG TERM GOAL #2   Title  The patient will report functional limitation < or equal to 45%    Baseline  73% limited    Time  6    Period  Weeks    Target Date  03/22/19      PT LONG TERM GOAL #3   Title  The patient will ambulate without assistive device x limited community distances independently.    Time  6    Period  Weeks    Target Date  03/22/19      PT LONG TERM GOAL #4   Title  The patient will negotiate 5 steps x 5 reps with one HR and reciprocal pattern mod indep.     Time  6    Period  Weeks    Target Date  03/22/19      PT LONG TERM GOAL #5   Title  The patient will improve gait speed to > or equal to 2.6 ft/sec to demo return to full community ambulation.    Baseline  0.49 ft/sec    Time  6    Period  Weeks    Target Date  03/22/19            Plan - 02/22/19 1625    Clinical Impression Statement  Pt demonstrating improved gait pattern with SPC today, good reciprocal arm swing and step length.  Pt tolerated all exercises well, without report of increased pain.  Pt making good progress towards LTGs.    Personal Factors and Comorbidities  Comorbidity 1;Comorbidity 2    Comorbidities  HTN, diabetes    Examination-Activity Limitations  Locomotion Level;Squat;Stairs;Sit    Stability/Clinical Decision Making  Stable/Uncomplicated    Rehab Potential  Good    PT Frequency  2x / week    PT Duration  6 weeks    PT Treatment/Interventions  ADLs/Self Care Home Management;Gait training;Stair training;Functional mobility training;Therapeutic activities;Therapeutic exercise;Balance training;Neuromuscular re-education;Cryotherapy;Electrical Stimulation;Iontophoresis 4mg /ml Dexamethasone;Manual techniques;Patient/family education;Taping    PT Next Visit Plan  Progress R LE strengthening, gait, stairs, ROM and functional loading of R LE    PT Home Exercise Plan  [id number]    Consulted and Agree with Plan of Care  Patient       Patient will benefit from skilled therapeutic intervention in order to improve the following deficits and impairments:  Abnormal gait, Decreased balance, Decreased strength, Decreased range of motion, Pain, Decreased activity tolerance, Impaired flexibility  Visit Diagnosis: Pain in right hip  Muscle weakness (generalized)  Other abnormalities of gait and mobility     Problem List Patient Active Problem List   Diagnosis Date Noted  . Left shoulder pain 12/19/2014  . External hemorrhoid 09/19/2014  . Degenerative  tear of  triangular fibrocartilage complex (TFCC) of right wrist 03/20/2014  . Essential hypertension, benign 03/20/2014  . Right lumbar radiculopathy 11/15/2013  . Radiculitis of left cervical region 11/15/2013  . Tinea versicolor 08/13/2013  . Obstructive uropathy 07/15/2013  . Allergic asthma 07/15/2013  . Hyperlipidemia 05/20/2013  . Diabetes mellitus type II, controlled ([place]) 05/20/2013  . Hydrocele 05/13/2013  . Obesity 05/13/2013  . Preventive measure 05/13/2013   Kerin Perna, PTA 02/22/19 4:29 PM  [place], [place], [address] Phone: [phone]   Fax:  [phone]  Name: Richard Rios MRN: [id number] Date of Birth: 07/07/1965

## 2019-02-25 ENCOUNTER — Ambulatory Visit (INDEPENDENT_AMBULATORY_CARE_PROVIDER_SITE_OTHER): Payer: No Typology Code available for payment source | Admitting: Physical Therapy

## 2019-02-25 ENCOUNTER — Other Ambulatory Visit: Payer: Self-pay

## 2019-02-25 DIAGNOSIS — M25551 Pain in right hip: Secondary | ICD-10-CM

## 2019-02-25 DIAGNOSIS — R2689 Other abnormalities of gait and mobility: Secondary | ICD-10-CM

## 2019-02-25 DIAGNOSIS — M6281 Muscle weakness (generalized): Secondary | ICD-10-CM

## 2019-02-25 NOTE — Therapy (Signed)
[place], [place], [address] Phone: [phone]   Fax:  [phone]  Physical Therapy Treatment  Patient Details  Name: Richard Rios MRN: [id number] Date of Birth: 1965-12-19 Referring Provider (PT): Alcus Dad, DO   Encounter Date: 02/25/2019  PT End of Session - 02/25/19 1606    Visit Number  6    Number of Visits  12    Date for PT Re-Evaluation  03/22/19    Authorization Type  VA authorized 12 visits post op    PT Start Time  1655    PT Stop Time  1746    PT Time Calculation (min)  51 min    Equipment Utilized During Treatment  Gait belt    Activity Tolerance  Patient tolerated treatment well    Behavior During Therapy  [facility] for tasks assessed/performed       No past medical history on file.  Past Surgical History:  Procedure Laterality Date  . ORCHIECTOMY Right   . VASECTOMY      There were no vitals filed for this visit.  Subjective Assessment - 02/25/19 1613    Subjective  Pt reports he has been working on walking with cane over weekend. Had one episode of weakness in Rt hip, felt like it gave way, "I had to go back to the walker".  He is trying to get massage appt for hip/groin.    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 noodle above knee   Other Knee/Hip Stretches   trial of supine Rt adductor stretch with strap and PTA assist x 15 sec       Knee/Hip Exercises: Aerobic   Nustep  L5 x 6.5 minutes LEs only    Other Aerobic  single laps in between exercises (with SPC) to decrease stiffness in hip after exercise.       Knee/Hip Exercises: Standing   Hip Extension  Stengthening;Right;2 sets;Left;1 set;10 reps    Lateral Step Up  Right;1 set;10 reps;Hand Hold: 1;Step Height: 6"    Stairs  reciprocal pattern x 20 steps (3-6" stairs with single hand on rail)     SLS  Rt SLS on blue pad x 15 sec without UE support.     Other Standing Knee Exercises  forward step up/ back on 3" foam x 10 reps (focus on slow controlled motion), repeated to Rt lateral step onto 3" foam (no UE support) x 10; semi-tandem stance on blue pads with horiz and vertical head turns x 30 sec each leg forward.      Knee/Hip Exercises: Seated   Sit to Sand  15 reps;without UE support   eccentric  lowering; NuStep chair     Modalities   Modalities  Electrical Stimulation;Moist Heat      Moist Heat Therapy   Number Minutes Moist Heat  10 Minutes    Moist Heat Location  --   Rt adductors     Electrical Stimulation   Electrical Stimulation Location  Rt adductors     Electrical Stimulation Action  IFC    Electrical Stimulation Parameters  10 min, intensity to tolerance     Electrical Stimulation Goals  Pain                  PT Long Term Goals - 02/25/19 1641      PT LONG TERM GOAL #1   Title  The patient will be independent with HEP    Time  6    Period  Weeks    Status  On-going      PT LONG TERM GOAL #2   Title  The patient will report functional limitation < or equal to 45%    Baseline  73% limited    Time  6    Period  Weeks    Status  On-going      PT LONG TERM GOAL #3   Title  The patient will ambulate without assistive device x limited community distances independently.    Time  6    Period  Weeks    Status  On-going      PT LONG TERM GOAL #4   Title   The patient will negotiate 5 steps x 5 reps with one HR and reciprocal pattern mod indep.    Time  6    Period  Weeks    Status  Achieved      PT LONG TERM GOAL #5   Title  The patient will improve gait speed to > or equal to 2.6 ft/sec to demo return to full community ambulation.    Baseline  0.49 ft/sec    Time  6    Period  Weeks    Status  On-going            Plan - 02/25/19 1640    Clinical Impression Statement  Pt reported some mild increase in pain in Rt groin with standing exercises, up to 4/10.  Pain reduced with use of estim/MHP to adductors at end of session.  Pt making good gains towards goals; has met LTG #4.    Personal Factors and Comorbidities  Comorbidity 1;Comorbidity 2    Comorbidities  HTN, diabetes    Examination-Activity Limitations  Locomotion Level;Squat;Stairs;Sit    Stability/Clinical Decision Making  Stable/Uncomplicated    Rehab Potential  Good    PT Frequency  2x / week    PT Duration  6 weeks    PT Treatment/Interventions  ADLs/Self Care Home Management;Gait training;Stair training;Functional mobility training;Therapeutic activities;Therapeutic exercise;Balance training;Neuromuscular re-education;Cryotherapy;Electrical Stimulation;Iontophoresis 42m/ml Dexamethasone;Manual techniques;Patient/family education;Taping    PT Next Visit Plan  Progress R LE strengthening, gait, stairs, ROM and functional loading of R LE.  Measure gait speed.    PT Home Exercise Plan  [id number]    Consulted and Agree with Plan of Care  Patient       Patient will benefit from skilled therapeutic intervention in order to improve the following deficits and impairments:  Abnormal gait, Decreased balance, Decreased strength, Decreased range of motion, Pain, Decreased activity tolerance, Impaired flexibility  Visit Diagnosis: Pain in right hip  Muscle weakness (generalized)  Other abnormalities of gait and mobility  Problem List Patient Active Problem List    Diagnosis Date Noted  . Left shoulder pain 12/19/2014  . External hemorrhoid 09/19/2014  . Degenerative tear of triangular fibrocartilage complex (TFCC) of right wrist 03/20/2014  . Essential hypertension, benign 03/20/2014  . Right lumbar radiculopathy 11/15/2013  . Radiculitis of left cervical region 11/15/2013  . Tinea versicolor 08/13/2013  . Obstructive uropathy 07/15/2013  . Allergic asthma 07/15/2013  . Hyperlipidemia 05/20/2013  . Diabetes mellitus type II, controlled ([place]) 05/20/2013  . Hydrocele 05/13/2013  . Obesity 05/13/2013  . Preventive measure 05/13/2013   Kerin Perna, PTA 02/25/19 4:43 PM  [place], [place], [address] Phone: [phone]   Fax:  [phone]  Name: Richard Rios MRN: [id number] Date of Birth: 10-01-1965

## 2019-02-28 ENCOUNTER — Encounter: Payer: BC Managed Care – PPO | Admitting: Physical Therapy

## 2019-03-04 ENCOUNTER — Encounter: Payer: Self-pay | Admitting: Physical Therapy

## 2019-03-04 ENCOUNTER — Other Ambulatory Visit: Payer: Self-pay

## 2019-03-04 ENCOUNTER — Ambulatory Visit (INDEPENDENT_AMBULATORY_CARE_PROVIDER_SITE_OTHER): Payer: No Typology Code available for payment source | Admitting: Physical Therapy

## 2019-03-04 DIAGNOSIS — R29898 Other symptoms and signs involving the musculoskeletal system: Secondary | ICD-10-CM

## 2019-03-04 DIAGNOSIS — R2689 Other abnormalities of gait and mobility: Secondary | ICD-10-CM

## 2019-03-04 DIAGNOSIS — M25551 Pain in right hip: Secondary | ICD-10-CM

## 2019-03-04 DIAGNOSIS — M6281 Muscle weakness (generalized): Secondary | ICD-10-CM | POA: Diagnosis not present

## 2019-03-04 NOTE — Therapy (Signed)
[place], [place], [address] Phone: [phone]   Fax:  [phone]  Physical Therapy Treatment  Patient Details  Name: Richard Rios MRN: [id number] Date of Birth: February 13, 1965 Referring Provider (PT): Alcus Dad, DO   Encounter Date: 03/04/2019  PT End of Session - 03/04/19 1606    Visit Number  7    Number of Visits  12    Date for PT Re-Evaluation  03/22/19    Authorization Type  VA authorized 12 visits post op    PT Start Time  2536    PT Stop Time  1645    PT Time Calculation (min)  47 min    Equipment Utilized During Treatment  Gait belt    Activity Tolerance  Patient tolerated treatment well;No increased pain    Behavior During Therapy  [facility] for tasks assessed/performed       History reviewed. No pertinent past medical history.  Past Surgical History:  Procedure Laterality Date  . ORCHIECTOMY Right   . VASECTOMY      There were no vitals filed for this visit.  Subjective Assessment - 03/04/19 1607    Subjective  Pt reports he had a flare up of pain last week after he "over did it".  He stretched, used ice/heat, and things calmed down.  He is no longer using a cane in the house, and has been using cane off/on in community.    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     Knee/Hip Exercises: Stretches   Passive Hamstring Stretch  Right;2 reps;20 seconds    Quad Stretch  Right;30 seconds;4 reps   prone, with strap. noodle above knee   Hip Flexor Stretch  Right;2 reps;20 seconds   standing with RLE extended.    Other Knee/Hip Stretches  supine Rt adductor stretch with strap x 15 sec x 2 reps       Knee/Hip Exercises: Standing   Heel Raises  Right;Left;1 set;5 reps;2 seconds    Hip Abduction  Stengthening;Right;Left;1 set;10 reps    SLS  Rt SLS forward leans to touch back of chair x 10 reps       Knee/Hip Exercises: Seated   Sit to Sand  15 reps;without UE support   eccentric lowering; NuStep chair. mirror for feedback     Knee/Hip Exercises: Supine   Bridges  1 set;5 reps   5 sec hold in  ext   Bridges Limitations  bridge with marching x 5 steps x 4 sets    Single Leg Bridge  Strengthening;Right;Left;5 reps    Straight Leg Raises  Strengthening;Right;1 set;5 reps    Straight Leg Raises Limitations  cues for quad set prior to lifting    Other Supine Knee/Hip Exercises  TA contraction x 5 sec x 3 reps with tactile cues.       Knee/Hip Exercises: Prone   Other Prone Exercises  modified prone plank x 15 sec (hands and knees)             PT Education - 03/04/19 1650    Education Details  HEP    Person(s) Educated  Patient    Methods  Explanation;Handout;Verbal cues;Demonstration    Comprehension  Returned demonstration;Verbalized understanding          PT Long Term Goals - 02/25/19 1641      PT LONG TERM GOAL #1   Title  The patient will be independent with HEP    Time  6    Period  Weeks    Status  On-going      PT LONG TERM GOAL #2   Title  The patient will report functional limitation < or equal to 45%    Baseline  73% limited     Time  6    Period  Weeks    Status  On-going      PT LONG TERM GOAL #3   Title  The patient will ambulate without assistive device x limited community distances independently.    Time  6    Period  Weeks    Status  On-going      PT LONG TERM GOAL #4   Title  The patient will negotiate 5 steps x 5 reps with one HR and reciprocal pattern mod indep.    Time  6    Period  Weeks    Status  Achieved      PT LONG TERM GOAL #5   Title  The patient will improve gait speed to > or equal to 2.6 ft/sec to demo return to full community ambulation.    Baseline  0.49 ft/sec    Time  6    Period  Weeks    Status  On-going            Plan - 03/04/19 1653    Clinical Impression Statement  Pt struggled with Rt SLR with full knee extension; fatigues quickly after 3 reps.  Pt tolerated exercises with minimal pain.  Pt continues to make good gains with each visit.    Personal Factors and Comorbidities  Comorbidity 1;Comorbidity 2    Comorbidities  HTN, diabetes    Examination-Activity Limitations  Locomotion Level;Squat;Stairs;Sit    Stability/Clinical Decision Making  Stable/Uncomplicated    Rehab Potential  Good    PT Frequency  2x / week    PT Duration  6 weeks    PT Treatment/Interventions  ADLs/Self Care Home Management;Gait training;Stair training;Functional mobility training;Therapeutic activities;Therapeutic exercise;Balance training;Neuromuscular re-education;Cryotherapy;Electrical Stimulation;Iontophoresis 4mg /ml Dexamethasone;Manual techniques;Patient/family education;Taping    PT Next Visit Plan  Progress R LE strengthening.   Measure gait speed.    PT Home Exercise Plan  [email or web address]    Consulted and Agree with Plan of Care  Patient       Patient will benefit from skilled therapeutic intervention in order to improve the following deficits and impairments:  Abnormal gait, Decreased balance, Decreased strength, Decreased range of  motion, Pain, Decreased activity tolerance, Impaired  flexibility  Visit Diagnosis: Pain in right hip  Muscle weakness (generalized)  Other abnormalities of gait and mobility  Other symptoms and signs involving the musculoskeletal system     Problem List Patient Active Problem List   Diagnosis Date Noted  . Left shoulder pain 12/19/2014  . External hemorrhoid 09/19/2014  . Degenerative tear of triangular fibrocartilage complex (TFCC) of right wrist 03/20/2014  . Essential hypertension, benign 03/20/2014  . Right lumbar radiculopathy 11/15/2013  . Radiculitis of left cervical region 11/15/2013  . Tinea versicolor 08/13/2013  . Obstructive uropathy 07/15/2013  . Allergic asthma 07/15/2013  . Hyperlipidemia 05/20/2013  . Diabetes mellitus type II, controlled (HCC) 05/20/2013  . Hydrocele 05/13/2013  . Obesity 05/13/2013  . Preventive measure 05/13/2013   Mayer Camel, PTA 03/04/19 5:09 PM  [facility] [place] [address] [place], [place], [address] Phone: [phone]   Fax:  [phone]  Name: Richard Rios MRN: [id number] Date of Birth: [birth year]

## 2019-03-04 NOTE — Patient Instructions (Signed)
Access Code: [email or web address]  URL: [email or web address]  Date: 03/04/2019  Prepared by: Mayer Camel   Exercises  Straight Leg Raise - 5 reps - 2 sets - 1x daily - 7x weekly  Sit to Stand with Armchair - 10 reps - 1 sets - 2x daily - 7x weekly  Standing Eccentric Heel Raise - 10 reps - 1-2 sets - 1x daily - 7x weekly  Single Leg Balance with Forward Lean - 10 reps - 1 sets - 1x daily - 7x weekly  Supine Bridge - 10 reps - 1 sets - 5 hold - 2x daily - 7x weekly  Marching Bridge - 4 reps - 5 sets - 1x daily - 7x weekly  Prone Quad Stretch with Towel Roll and Strap - 2-3 reps - 1 sets - 30 hold - 2x daily - 7x weekly  Hooklying Hamstring Stretch with Strap - 2-3 reps - 1 sets - 15-30 hold - 1x daily - 7x weekly  Hip Adductors and Hamstring Stretch with Strap - 2-3 reps - 1 sets - 15-30 hold - 1x daily - 7x weekly

## 2019-03-07 ENCOUNTER — Ambulatory Visit (INDEPENDENT_AMBULATORY_CARE_PROVIDER_SITE_OTHER): Payer: No Typology Code available for payment source | Admitting: Physical Therapy

## 2019-03-07 ENCOUNTER — Other Ambulatory Visit: Payer: Self-pay

## 2019-03-07 DIAGNOSIS — R29898 Other symptoms and signs involving the musculoskeletal system: Secondary | ICD-10-CM | POA: Diagnosis not present

## 2019-03-07 DIAGNOSIS — M25551 Pain in right hip: Secondary | ICD-10-CM

## 2019-03-07 DIAGNOSIS — R2689 Other abnormalities of gait and mobility: Secondary | ICD-10-CM

## 2019-03-07 DIAGNOSIS — M6281 Muscle weakness (generalized): Secondary | ICD-10-CM | POA: Diagnosis not present

## 2019-03-07 NOTE — Therapy (Signed)
[place], [place], [address] Phone: [phone]   Fax:  [phone]  Physical Therapy Treatment  Patient Details  Name: Richard Rios MRN: [id number] Date of Birth: 01-Apr-1965 Referring Provider (PT): Alcus Dad, DO   Encounter Date: 03/07/2019  PT End of Session - 03/07/19 1700    Visit Number  8    Number of Visits  12    Date for PT Re-Evaluation  03/22/19    Authorization Type  VA authorized 12 visits post op    PT Start Time  1655    PT Stop Time  1745    PT Time Calculation (min)  50 min    Equipment Utilized During Treatment  Gait belt    Activity Tolerance  Patient tolerated treatment well    Behavior During Therapy  [facility] for tasks assessed/performed       No past medical history on file.  Past Surgical History:  Procedure Laterality Date  . ORCHIECTOMY Right   . VASECTOMY      There were no vitals filed for this visit.  Subjective Assessment - 03/07/19 1700    Subjective  Pt reports he is very challenged by the single leg deadlifts, "If I do that exercise last, I'm on the floor" reporting pain in muscles of thigh afterwards.   His Rt groin and thigh continue to be sore.    Currently in Pain?  Yes    Pain Score  4     Pain Location  Groin    Pain Orientation  Right    Pain Descriptors / Indicators  Sore    Aggravating Factors   overdoing it.    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       Lumbar Exercises: Quadruped   Straight Leg Raise  10 reps   RLE only   Other Quadruped Lumbar Exercises  weight shifts x 5 reps - some discomfort in Rt groin with weight shift into Rt knee.       Knee/Hip Exercises: Stretches   Passive Hamstring Stretch  Right;2 reps;20 seconds    Quad Stretch  Right;30 seconds;4 reps   prone, with strap. noodle above knee   Quad Stretch Limitations  shown seated version x 1 rep each leg x 20 sec     Gastroc Stretch  Right;Left;2 reps;20 seconds      Knee/Hip Exercises: Aerobic   Recumbent Bike  L2: 5.5 min       Knee/Hip Exercises: Standing   Lateral Step Up  Right;1 set;10 reps;Hand Hold: 1;Step Height:  8"      Knee/Hip Exercises: Supine   Straight Leg Raises  Right;1 set   3 reps     Modalities   Modalities  --   deferred; will use at home.      Manual Therapy   Manual therapy comments  I strip of reg [place] tape applied to Rt lateral thigh at knee with 20% stretch and I piece over pes anserine, to decompress tissue and increase proprioception.     Soft tissue mobilization  TPR to Rt adductors with contract/relax ; IASTM and STM to Rt quad distal to mid, to decrease fascial restrictions and improve mobility.                   PT Long Term Goals - 03/07/19 1755      PT LONG TERM GOAL #1   Title  The patient will be independent with HEP    Time  6    Period  Weeks    Status  On-going      PT LONG TERM GOAL #2   Title  The patient will report functional limitation < or equal to 45%    Baseline  73% limited    Time  6    Period  Weeks    Status  On-going      PT LONG TERM GOAL #3   Title  The patient will ambulate without assistive device x limited community distances independently.    Time  6    Period  Weeks    Status  On-going       PT LONG TERM GOAL #4   Title  The patient will negotiate 5 steps x 5 reps with one HR and reciprocal pattern mod indep.    Time  6    Period  Weeks    Status  Achieved      PT LONG TERM GOAL #5   Title  The patient will improve gait speed to > or equal to 2.6 ft/sec to demo return to full community ambulation.    Time  6    Period  Weeks    Status  Achieved            Plan - 03/07/19 1752    Clinical Impression Statement  Pt continues to fatigue quickly with Rt SLR; signs of extensor lag after 3 reps. Palpable tightness and tenderness in Rt quad and adductors; improved with STM.  Pt reported reduction of groin/thigh pain after manual therapy.  Pt encouraged to have active rest days, when he is not over exerting himself, increasing his LE pain. Pt had some increased groin and knee pain with increased gait distance; reduced after seated rest and STM. Pt's gait speed has improved; has met LTG# 5.    Personal Factors and Comorbidities  Comorbidity 1;Comorbidity 2    Comorbidities  HTN, diabetes    Examination-Activity Limitations  Locomotion Level;Squat;Stairs;Sit    Stability/Clinical Decision Making  Stable/Uncomplicated    Rehab Potential  Good    PT Frequency  2x / week    PT Duration  6 weeks    PT Treatment/Interventions  ADLs/Self Care Home Management;Gait training;Stair training;Functional mobility training;Therapeutic activities;Therapeutic exercise;Balance training;Neuromuscular re-education;Cryotherapy;Electrical Stimulation;Iontophoresis 20m/ml Dexamethasone;Manual techniques;Patient/family education;Taping    PT Next Visit Plan  Progress R LE strengthening.   IASTM/STM as needed.    PT Home Exercise Plan  [email or web address]    Consulted and Agree with Plan of Care  Patient  Patient will benefit from skilled therapeutic intervention in order to improve the following deficits and impairments:  Abnormal gait, Decreased balance, Decreased strength, Decreased range of motion,  Pain, Decreased activity tolerance, Impaired flexibility  Visit Diagnosis: Pain in right hip  Muscle weakness (generalized)  Other abnormalities of gait and mobility  Other symptoms and signs involving the musculoskeletal system     Problem List Patient Active Problem List   Diagnosis Date Noted  . Left shoulder pain 12/19/2014  . External hemorrhoid 09/19/2014  . Degenerative tear of triangular fibrocartilage complex (TFCC) of right wrist 03/20/2014  . Essential hypertension, benign 03/20/2014  . Right lumbar radiculopathy 11/15/2013  . Radiculitis of left cervical region 11/15/2013  . Tinea versicolor 08/13/2013  . Obstructive uropathy 07/15/2013  . Allergic asthma 07/15/2013  . Hyperlipidemia 05/20/2013  . Diabetes mellitus type II, controlled ([place]) 05/20/2013  . Hydrocele 05/13/2013  . Obesity 05/13/2013  . Preventive measure 05/13/2013   Kerin Perna, PTA 03/07/19 6:01 PM  [facility] [place], [place], [address] Phone: [phone]   Fax:  [phone]  Name: Richard Rios MRN: [id number] Date of Birth: 1965/05/07

## 2019-03-13 ENCOUNTER — Other Ambulatory Visit: Payer: Self-pay

## 2019-03-13 ENCOUNTER — Ambulatory Visit (INDEPENDENT_AMBULATORY_CARE_PROVIDER_SITE_OTHER): Payer: No Typology Code available for payment source | Admitting: Physical Therapy

## 2019-03-13 DIAGNOSIS — R2689 Other abnormalities of gait and mobility: Secondary | ICD-10-CM | POA: Diagnosis not present

## 2019-03-13 DIAGNOSIS — M25551 Pain in right hip: Secondary | ICD-10-CM

## 2019-03-13 DIAGNOSIS — M6281 Muscle weakness (generalized): Secondary | ICD-10-CM | POA: Diagnosis not present

## 2019-03-13 DIAGNOSIS — R29898 Other symptoms and signs involving the musculoskeletal system: Secondary | ICD-10-CM

## 2019-03-13 NOTE — Therapy (Signed)
[place], [place], [address] Phone: [phone]   Fax:  [phone]  Physical Therapy Treatment  Patient Details  Name: Richard Rios MRN: [id number] Date of Birth: [birth year] Referring Provider (PT): Alcus Dad, DO   Encounter Date: 03/13/2019  PT End of Session - 03/13/19 0958    Visit Number  9    Number of Visits  12    Date for PT Re-Evaluation  03/22/19    Authorization Type  VA authorized 12 visits post op    PT Start Time  [phone]    PT Stop Time  1042    PT Time Calculation (min)  50 min    Equipment Utilized During Treatment  Gait belt    Activity Tolerance  Patient tolerated treatment well    Behavior During Therapy  [facility] for tasks assessed/performed       No past medical history on file.  Past Surgical History:  Procedure Laterality Date  . ORCHIECTOMY Right   . VASECTOMY      There were no vitals filed for this visit.  Subjective Assessment - 03/13/19 1005    Subjective  Pt reports he has dialed back his exercises and even returned to some earlier exercises and his pain in thigh has reduced.  He continues to have burning pain in ant thigh and around knee, intermittent. He hasn't figured out what initiates it.    Currently in Pain?  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       Knee/Hip Exercises: Stretches   Passive Hamstring Stretch  Right;2 reps;30 seconds   supine with strap   Quad Stretch  Right;30 seconds;2 reps   prone, with strap     Knee/Hip Exercises: Aerobic   Elliptical  L0: 3 min forward, 1 min backward (challenging)      Knee/Hip Exercises: Machines for Strengthening   Cybex Knee Extension  2 plates: up with BLE, down with RLE (5 sec eccentric lowering)    Cybex Leg Press  5 plates, RLE x 10 slow reps;seat at 7      Knee/Hip Exercises: Standing   SLS  Rt SLS on mini tramp x 20 sec, without and with head motions, 2 reps      Knee/Hip Exercises: Prone   Hip Extension  Strengthening;Right;1 set;10 reps   knee flexed   Straight Leg Raises  Strengthening;Right;Left;1 set;10 reps    Other Prone Exercises  opp arm/leg x 5 reps each side.       Manual Therapy   Manual Therapy  Myofascial release    Soft tissue mobilization  IASTM and STM to Rt lateral quad and rectus femoris     Myofascial Release  MFR to Rt quad, ITB                  PT Long Term Goals - 03/13/19 1200      PT LONG TERM GOAL #1   Title  The patient will be independent with HEP    Time  6    Period  Weeks    Status  On-going      PT LONG TERM GOAL #2   Title  The patient will report functional limitation < or equal to 45%    Baseline  73% limited    Time  6    Period  Weeks    Status  On-going      PT LONG TERM GOAL #3   Title  The patient will ambulate without assistive device x limited community distances independently.    Time  6    Period  Weeks    Status  Achieved      PT LONG TERM GOAL #4   Title  The patient will negotiate 5 steps x 5 reps with one HR and reciprocal pattern mod indep.    Time  6    Period  Weeks    Status  Achieved      PT LONG TERM GOAL #5   Title  The patient will improve gait speed to  > or equal to 2.6 ft/sec to demo return to full community ambulation.    Time  6    Period  Weeks    Status  Achieved            Plan - 03/13/19 1156    Clinical Impression Statement  Pt now reporting ability to ambulate short distances in community without use of SPC.  Pt tolerated exercises well, without report of increased pain, only fatigue.  Palpable fascial tightness noted in pt's Rt mid-distal vastus lateralis (tender as well); improved with MFR to area.  Pt reported reduction of pain level after MFR at end of session. Pt has met LTG#3.    Personal Factors and Comorbidities  Comorbidity 1;Comorbidity 2    Comorbidities  HTN, diabetes    Examination-Activity Limitations  Locomotion Level;Squat;Stairs;Sit    Stability/Clinical Decision Making  Stable/Uncomplicated    Rehab Potential  Good    PT Frequency  2x / week    PT Duration  6 weeks    PT Treatment/Interventions  ADLs/Self Care Home Management;Gait training;Stair training;Functional mobility training;Therapeutic activities;Therapeutic exercise;Balance training;Neuromuscular re-education;Cryotherapy;Electrical Stimulation;Iontophoresis 7m/ml Dexamethasone;Manual techniques;Patient/family education;Taping    PT Next Visit Plan  Progress R LE strengthening.   MFR to Rt thigh.    PT Home Exercise Plan  [email or web address]    Consulted and Agree with Plan of Care  Patient       Patient will benefit from skilled therapeutic intervention in order to improve the following deficits and impairments:  Abnormal gait, Decreased balance, Decreased strength, Decreased range of motion, Pain, Decreased activity tolerance, Impaired flexibility  Visit Diagnosis: Pain in right hip  Muscle weakness (generalized)  Other abnormalities of gait and mobility  Other symptoms and signs involving the musculoskeletal system     Problem List Patient Active Problem List   Diagnosis Date Noted  . Left shoulder pain  12/19/2014  . External hemorrhoid  09/19/2014  . Degenerative tear of triangular fibrocartilage complex (TFCC) of right wrist 03/20/2014  . Essential hypertension, benign 03/20/2014  . Right lumbar radiculopathy 11/15/2013  . Radiculitis of left cervical region 11/15/2013  . Tinea versicolor 08/13/2013  . Obstructive uropathy 07/15/2013  . Allergic asthma 07/15/2013  . Hyperlipidemia 05/20/2013  . Diabetes mellitus type II, controlled ([place]) 05/20/2013  . Hydrocele 05/13/2013  . Obesity 05/13/2013  . Preventive measure 05/13/2013   Kerin Perna, PTA 03/13/19 12:00 PM  [place], [place], [address] Phone: [phone]   Fax:  [phone]  Name: Richard Rios MRN: [id number] Date of Birth: 01/14/1965

## 2019-03-15 ENCOUNTER — Encounter: Payer: Non-veteran care | Admitting: Physical Therapy

## 2019-03-15 ENCOUNTER — Other Ambulatory Visit: Payer: Self-pay

## 2019-03-19 ENCOUNTER — Ambulatory Visit (INDEPENDENT_AMBULATORY_CARE_PROVIDER_SITE_OTHER): Payer: No Typology Code available for payment source | Admitting: Physical Therapy

## 2019-03-19 ENCOUNTER — Other Ambulatory Visit: Payer: Self-pay

## 2019-03-19 DIAGNOSIS — M6281 Muscle weakness (generalized): Secondary | ICD-10-CM | POA: Diagnosis not present

## 2019-03-19 DIAGNOSIS — R29898 Other symptoms and signs involving the musculoskeletal system: Secondary | ICD-10-CM

## 2019-03-19 DIAGNOSIS — M25551 Pain in right hip: Secondary | ICD-10-CM

## 2019-03-19 DIAGNOSIS — R2689 Other abnormalities of gait and mobility: Secondary | ICD-10-CM | POA: Diagnosis not present

## 2019-03-19 NOTE — Therapy (Signed)
[place], [place], [address] Phone: [phone]   Fax:  [phone]  Physical Therapy Treatment  Patient Details  Name: Richard Rios MRN: [id number] Date of Birth: 1965-09-01 Referring Provider (PT): Alcus Dad, DO   Encounter Date: 03/19/2019  PT End of Session - 03/19/19 1016    Visit Number  10    Number of Visits  12    Date for PT Re-Evaluation  03/22/19    Authorization Type  VA authorized 12 visits post op    PT Start Time  1012    PT Stop Time  1057    PT Time Calculation (min)  45 min    Equipment Utilized During Treatment  Gait belt    Activity Tolerance  Patient tolerated treatment well    Behavior During Therapy  [facility] for tasks assessed/performed       No past medical history on file.  Past Surgical History:  Procedure Laterality Date  . ORCHIECTOMY Right   . VASECTOMY      There were no vitals filed for this visit.  Subjective Assessment - 03/19/19 1122    Subjective  Pt reports he attempted to receive massage therapy at another facility, with coverage from [place].  He plans to contact [place] and see if he can have additional visits to continue therapy at [facility] instead.  He has started rolling his thigh and continues to use TENS for pain.    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  Yes    Pain Score  5     Pain Location  Hip   thigh and groin   Pain Orientation  Anterior;Right    Pain Descriptors / Indicators  Jabbing;Tightness    Aggravating Factors   overdoing it    Pain Relieving Factors  TENS, heat. 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)    Referring Provider (PT)  Alcus Dad, DO    Onset Date/Surgical Date  02/06/19    Hand Dominance  Right    Next MD Visit  04/03/19    Prior Therapy  none       Observation/Other Assessments   Focus on Therapeutic Outcomes (FOTO)   47% (53% limitation)      Strength   Right Hip Flexion  4+/5    Right Hip Extension  5/5    Right Hip External Rotation   4+/5    Right Hip Internal Rotation  3+/5    Right Hip ABduction  4+/5    Right Hip ADduction  --        [facility] Adult PT Treatment/Exercise - 03/19/19 0001      Self-Care   Other Self-Care Comments   pt educated in self-TPR to Rt adductors with ball on thigh in prone position;  VC utilized and pt was able to return demo.       Knee/Hip Exercises: Stretches   Sports administrator  Right;30 seconds;4 reps   prone, with strap, noodle above knee     Knee/Hip Exercises: Aerobic   Recumbent Bike  L3: 5 min      Knee/Hip Exercises: Supine   Straight Leg Raises  Strengthening;Right;1 set;5 reps   eccentric lowering     Knee/Hip Exercises: Sidelying  Hip ADduction Limitations  Rt - 3 reps, small range.       Knee/Hip Exercises: Prone   Hip Extension  Strengthening;Right;1 set;10 reps   knee flexed   Straight Leg Raises  Strengthening;Right;Left;1 set;10 reps      Manual Therapy   Manual Therapy  Myofascial release;Soft tissue mobilization    Soft tissue mobilization  IASTM and STM to Rt lateral quad, rectus femoris, adductors  to decrease fascial restrictions;  deep tissue work into pectineus, gracilius, rectus femoris, fascia between lateral Rt hamstring and ITB (distally)    Myofascial Release  MFR to Rt quad, distal adductors, distal ITB.                    PT Long Term Goals - 03/19/19 1130      PT LONG TERM GOAL #1   Title  The patient will be independent with HEP    Time  6    Period  Weeks    Status  On-going      PT LONG TERM GOAL #2   Title  The patient will report functional limitation < or equal to 45%    Baseline  53% limitation - 03/19/19    Time  6    Period  Weeks    Status  On-going      PT LONG TERM GOAL #3   Title  The patient will ambulate without  assistive device x limited community distances independently.    Time  6    Period  Weeks    Status  Achieved      PT LONG TERM GOAL #4   Title  The patient will negotiate 5 steps x 5 reps with one HR and reciprocal pattern mod indep.    Time  6    Period  Weeks    Status  Achieved      PT LONG TERM GOAL #5   Title  The patient will improve gait speed to > or equal to 2.6 ft/sec to demo return to full community ambulation.    Time  6    Period  Weeks    Status  Achieved            Plan - 03/19/19 1112    Clinical Impression Statement  Pt's Rt hip strength is much improved from last assessment.  Continued report of pain in adductors and mid quad with gait and exercise, up to 5/10.  Pt reported a reduction to 2/10 after manual therapy.  Pt now ambulating without AD.  FOTO score improved but he is not at goal yet. Progressing well towards remaining goals.    Personal Factors and Comorbidities  Comorbidity 1;Comorbidity 2    Comorbidities  HTN, diabetes    Examination-Activity Limitations  Locomotion Level;Squat;Stairs;Sit    Stability/Clinical Decision Making  Stable/Uncomplicated    Rehab Potential  Good    PT Frequency  2x / week    PT Duration  6 weeks    PT Treatment/Interventions  ADLs/Self Care Home Management;Gait training;Stair training;Functional mobility training;Therapeutic activities;Therapeutic exercise;Balance training;Neuromuscular re-education;Cryotherapy;Electrical Stimulation;Iontophoresis 4mg /ml Dexamethasone;Manual techniques;Patient/family education;Taping    PT Next Visit Plan  Progress R LE strengthening.   MFR to Rt thigh.    PT Home Exercise Plan  [id number]    Consulted and Agree with Plan of Care  Patient       Patient will benefit from skilled therapeutic intervention in order to improve the following deficits and impairments:  Abnormal gait, Decreased balance,  Decreased strength, Decreased range of motion, Pain, Decreased activity tolerance, Impaired  flexibility  Visit Diagnosis: Pain in right hip  Muscle weakness (generalized)  Other abnormalities of gait and mobility  Other symptoms and signs involving the musculoskeletal system     Problem List Patient Active Problem List   Diagnosis Date Noted  . Left shoulder pain 12/19/2014  . External hemorrhoid 09/19/2014  . Degenerative tear of triangular fibrocartilage complex (TFCC) of right wrist 03/20/2014  . Essential hypertension, benign 03/20/2014  . Right lumbar radiculopathy 11/15/2013  . Radiculitis of left cervical region 11/15/2013  . Tinea versicolor 08/13/2013  . Obstructive uropathy 07/15/2013  . Allergic asthma 07/15/2013  . Hyperlipidemia 05/20/2013  . Diabetes mellitus type II, controlled (HCC) 05/20/2013  . Hydrocele 05/13/2013  . Obesity 05/13/2013  . Preventive measure 05/13/2013   Mayer Camel, PTA 03/19/19 11:30 AM  [facility] [address] [place], [place], [address] Phone: [phone]   Fax:  [phone]  Name: Richard Rios MRN: [id number] Date of Birth: 14-May-1965

## 2019-03-22 ENCOUNTER — Other Ambulatory Visit: Payer: Self-pay

## 2019-03-22 ENCOUNTER — Ambulatory Visit (INDEPENDENT_AMBULATORY_CARE_PROVIDER_SITE_OTHER): Payer: No Typology Code available for payment source | Admitting: Physical Therapy

## 2019-03-22 DIAGNOSIS — M25551 Pain in right hip: Secondary | ICD-10-CM | POA: Diagnosis not present

## 2019-03-22 DIAGNOSIS — R29898 Other symptoms and signs involving the musculoskeletal system: Secondary | ICD-10-CM

## 2019-03-22 DIAGNOSIS — M6281 Muscle weakness (generalized): Secondary | ICD-10-CM | POA: Diagnosis not present

## 2019-03-22 DIAGNOSIS — R2689 Other abnormalities of gait and mobility: Secondary | ICD-10-CM

## 2019-03-22 NOTE — Therapy (Signed)
[place], [place], [address] Phone: [phone]   Fax:  [phone]  Physical Therapy Treatment  Patient Details  Name: Richard Rios MRN: [id number] Date of Birth: 1965/12/14 Referring Provider (PT): Alcus Dad, DO   Encounter Date: 03/22/2019  PT End of Session - 03/22/19 1404    Visit Number  11    Number of Visits  12    Date for PT Re-Evaluation  03/22/19    Authorization Type  VA authorized 12 visits post op    PT Start Time  1348    PT Stop Time  1432    PT Time Calculation (min)  44 min    Equipment Utilized During Treatment  Gait belt    Activity Tolerance  Patient tolerated treatment well    Behavior During Therapy  [facility] for tasks assessed/performed       No past medical history on file.  Past Surgical History:  Procedure Laterality Date  . ORCHIECTOMY Right   . VASECTOMY      There were no vitals filed for this visit.  Subjective Assessment - 03/22/19 1447    Subjective Pt reports he has been doing ball release work on ant Rt thigh, groin and this has helped reduce his pain.  He is pleased with progress this far.     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    Other  Knee/Hip Stretches  standing adductor stretch with wide stance x 10 sec x 4 reps each direction       Knee/Hip Exercises: Aerobic   Recumbent Bike  L3: 5 min      Knee/Hip Exercises: Standing   SLS  slow toe taps to cup on 12" step x 10 (alterating LEs)    Other Standing Knee Exercises  Rt/Lt warrior I to II x 20 sec holds in each position.     Other Standing Knee Exercises  attempts at Rt single leg mini squat with UE support on sink - challenging and RLE tremulous x 10 reps (with varied support through LLE)      Manual Therapy   Manual therapy comments  I strip of reg [place]     Soft tissue mobilization  deep work through [employer] quad; IASTM to Rt distal medial knee to decrease fascial restrictions      Myofascial Release  MFR to Rt quad, distal adductors, distal ITB.                    PT  Long Term Goals - 03/19/19 1130      PT LONG TERM GOAL #1   Title  The patient will be independent with HEP    Time  6    Period  Weeks    Status  On-going      PT LONG TERM GOAL #2   Title  The patient will report functional limitation < or equal to 45%    Baseline  53% limitation - 03/19/19    Time  6    Period  Weeks    Status  On-going      PT LONG TERM GOAL #3   Title  The patient will ambulate without assistive device x limited community distances independently.    Time  6    Period  Weeks    Status  Achieved      PT LONG TERM GOAL #4   Title  The patient will negotiate 5 steps x 5 reps with one HR and reciprocal pattern mod indep.    Time  6    Period  Weeks    Status  Achieved      PT LONG TERM GOAL #5   Title  The patient will improve gait speed to > or equal to 2.6 ft/sec to demo return to full community ambulation.    Time  6    Period  Weeks    Status  Achieved            Plan - 03/22/19 1650    Clinical Impression Statement  Pt arrived without any groin pain, however medial Rt knee pain.  With high kneeling hip flexor stretch, was able to eliminate  knee pain, but groin pain returned.  Pt has difficulty loading RLE (single leg) with any forward trunk flexion.  Continues with some functional Rt quad weakness.  Pt has one additional visit approved at this time; has made great gains each visit.    Personal Factors and Comorbidities  Comorbidity 1;Comorbidity 2    Comorbidities  HTN, diabetes    Examination-Activity Limitations  Locomotion Level;Squat;Stairs;Sit    Stability/Clinical Decision Making  Stable/Uncomplicated    Rehab Potential  Good    PT Frequency  2x / week    PT Duration  6 weeks    PT Treatment/Interventions  ADLs/Self Care Home Management;Gait training;Stair training;Functional mobility training;Therapeutic activities;Therapeutic exercise;Balance training;Neuromuscular re-education;Cryotherapy;Electrical Stimulation;Iontophoresis 4mg /ml Dexamethasone;Manual techniques;Patient/family education;Taping    PT Next Visit Plan  FOTO, assess goals.    PT Home Exercise Plan  [id number]    Consulted and Agree with Plan of Care  Patient       Patient will benefit from skilled therapeutic intervention in order to improve the following deficits and impairments:  Abnormal gait, Decreased balance, Decreased strength, Decreased range of motion, Pain, Decreased activity tolerance, Impaired flexibility  Visit Diagnosis: Pain in right hip  Muscle weakness (generalized)  Other abnormalities of gait and mobility  Other symptoms and signs involving the musculoskeletal system     Problem List Patient Active Problem List   Diagnosis Date Noted  . Left shoulder pain 12/19/2014  . External hemorrhoid 09/19/2014  . Degenerative tear of triangular fibrocartilage complex (TFCC) of right wrist 03/20/2014  . Essential hypertension, benign 03/20/2014  . Right lumbar radiculopathy 11/15/2013  . Radiculitis of left cervical region 11/15/2013  . Tinea versicolor 08/13/2013  . Obstructive uropathy 07/15/2013  . Allergic asthma 07/15/2013  .  Hyperlipidemia 05/20/2013  . Diabetes mellitus type II, controlled (HCC) 05/20/2013  . Hydrocele  05/13/2013  . Obesity 05/13/2013  . Preventive measure 05/13/2013    Salvadore Oxford 03/22/2019, 4:58 PM  [facility] [address] [place], [place], [address] Phone: [phone]   Fax:  [phone]  Name: Richard Rios MRN: [id number] Date of Birth: 05/15/1965

## 2019-03-26 ENCOUNTER — Ambulatory Visit (INDEPENDENT_AMBULATORY_CARE_PROVIDER_SITE_OTHER): Payer: No Typology Code available for payment source | Admitting: Physical Therapy

## 2019-03-26 DIAGNOSIS — M25551 Pain in right hip: Secondary | ICD-10-CM

## 2019-03-26 DIAGNOSIS — R2689 Other abnormalities of gait and mobility: Secondary | ICD-10-CM

## 2019-03-26 DIAGNOSIS — M6281 Muscle weakness (generalized): Secondary | ICD-10-CM

## 2019-03-26 NOTE — Therapy (Addendum)
[place], [place], [address] Phone: [phone]   Fax:  [phone]  Physical Therapy Treatment and Discharge Summary  Patient Details  Name: Richard Rios MRN: [id number] Date of Birth: 1965-03-16 Referring Provider (PT): Alcus Dad, DO   Encounter Date: 03/26/2019  PT End of Session - 03/26/19 1129    Visit Number  12    Number of Visits  12    Authorization Type  VA authorized 12 visits post op    PT Start Time  1007    PT Stop Time  1058    PT Time Calculation (min)  51 min    Equipment Utilized During Treatment  Gait belt    Activity Tolerance  Patient tolerated treatment well    Behavior During Therapy  [facility] for tasks assessed/performed       No past medical history on file.  Past Surgical History:  Procedure Laterality Date  . ORCHIECTOMY Right   . VASECTOMY      There were no vitals filed for this visit.  Subjective Assessment - 03/26/19 1016    Subjective  Pt reports he continues to have some stiffness and pain in ant/medial Rt thigh. He is not using the TENS as much.    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    Other Quadruped Lumbar Exercises  weight shifts Rt/Lt into hands/knees      Knee/Hip Exercises: Stretches   Passive Hamstring Stretch  Right;1 rep;20 seconds    Hip Flexor Stretch  Right;2 reps;20 seconds    ITB Stretch  Right;1 rep;20 seconds   supine with strap, to tolerance   Other Knee/Hip Stretches  standing adductor stretch with wide stance x 10 sec x 4 reps each direction, supine adductor stretch x 20 sec with strap      Knee/Hip Exercises: Aerobic   Elliptical  L2: 3 min for warm up.     Tread Mill  retro 1.4 mph x 4 min, cues for even step length, cues on Rt hip ext.       Knee/Hip Exercises: Standing   SLS  Rt SLS hip hinge (small range) x 5 reps, 2 sets.       Knee/Hip Exercises: Sidelying   Clams  R LE x 10 reps      Manual Therapy  Manual therapy comments  I strip of reg Rock tape in zigzag pattern over Rt hip incision to assist in scar management.      Soft tissue mobilization  deep STM to Rt hip adductors and lateral Rt quad     Myofascial Release  MFR to Rt quad, distal adductors, distal ITB.                    PT Long Term Goals - 03/26/19 1120      PT LONG TERM GOAL #1   Title  The patient will be independent with HEP    Time  6    Period  Weeks    Status  Achieved      PT LONG TERM GOAL #2   Title  The patient will report functional limitation < or equal to 45%    Time  6    Period  Weeks    Status  Achieved      PT LONG TERM GOAL #3   Title  The patient will ambulate without assistive device x limited community distances independently.    Time  6    Period  Weeks    Status  Achieved      PT LONG TERM GOAL #4   Title  The patient will negotiate 5 steps x 5 reps with one HR and reciprocal pattern mod indep.    Time  6    Period  Weeks    Status  Achieved      PT LONG TERM GOAL #5   Title  The patient will improve gait speed to > or equal to 2.6 ft/sec to demo return to full community ambulation.     Time  6    Period  Weeks    Status  Achieved            Plan - 03/26/19 1126    Clinical Impression Statement  Pt continues to have difficulty with Rt SLS with hip hinge due to increased Rt groin/quad discomfort. Pt tolerated exercises quadruped without symptoms. Pt has met all goals and verbalized readiness to d/c to HEP at this time.    Personal Factors and Comorbidities  Comorbidity 1;Comorbidity 2    Comorbidities  HTN, diabetes    Examination-Activity Limitations  Locomotion Level;Squat;Stairs;Sit    Stability/Clinical Decision Making  Stable/Uncomplicated    Rehab Potential  Good    PT Frequency  2x / week    PT Duration  6 weeks    PT Treatment/Interventions  ADLs/Self Care Home Management;Gait training;Stair training;Functional mobility training;Therapeutic activities;Therapeutic exercise;Balance training;Neuromuscular re-education;Cryotherapy;Electrical Stimulation;Iontophoresis 1m/ml Dexamethasone;Manual techniques;Patient/family education;Taping    PT Next Visit Plan  FOTO, assess goals.    PT Home Exercise Plan  [email or web address]    Consulted and Agree with Plan of Care  Patient       Patient will benefit from skilled therapeutic intervention in order to improve the following deficits and impairments:  Abnormal gait, Decreased balance, Decreased strength, Decreased range of motion, Pain, Decreased activity tolerance, Impaired flexibility  Visit Diagnosis: Pain in right hip  Muscle weakness (generalized)  Other abnormalities of gait and mobility     Problem List Patient Active Problem List   Diagnosis Date Noted  . Left shoulder pain 12/19/2014  . External hemorrhoid 09/19/2014  . Degenerative tear of triangular fibrocartilage complex (TFCC) of right wrist 03/20/2014  . Essential hypertension, benign 03/20/2014  . Right lumbar radiculopathy 11/15/2013  . Radiculitis of left cervical region 11/15/2013  .  Tinea versicolor 08/13/2013  . Obstructive uropathy 07/15/2013   . Allergic asthma 07/15/2013  . Hyperlipidemia 05/20/2013  . Diabetes mellitus type II, controlled ([place]) 05/20/2013  . Hydrocele 05/13/2013  . Obesity 05/13/2013  . Preventive measure 05/13/2013    PHYSICAL THERAPY DISCHARGE SUMMARY  Visits from Start of Care: 12  Current functional level related to goals / functional outcomes: See above   Remaining deficits: R single limb stance control-- addressing through HEP.   Education / Equipment: Home program  Plan: Patient agrees to discharge.  Patient goals were met. Patient is being discharged due to meeting the stated rehab goals.  ?????        Thank you for the referral of this patient. Rudell Cobb, MPT  Kerin Perna, PTA 03/26/19 11:35 AM  [facility] [place], [place], [address] Phone: [phone]   Fax:  [phone]  Name: Richard Rios MRN: [id number] Date of Birth: 1965/11/04

## 2019-03-29 ENCOUNTER — Encounter: Payer: BC Managed Care – PPO | Admitting: Rehabilitative and Restorative Service Providers"

## 2019-04-01 ENCOUNTER — Encounter: Payer: Self-pay | Admitting: Physical Therapy

## 2020-09-03 ENCOUNTER — Other Ambulatory Visit: Payer: Self-pay | Admitting: Podiatry

## 2020-09-03 ENCOUNTER — Ambulatory Visit (INDEPENDENT_AMBULATORY_CARE_PROVIDER_SITE_OTHER): Payer: BC Managed Care – PPO | Admitting: Podiatry

## 2020-09-03 ENCOUNTER — Other Ambulatory Visit: Payer: Self-pay

## 2020-09-03 DIAGNOSIS — L600 Ingrowing nail: Secondary | ICD-10-CM | POA: Diagnosis not present

## 2020-09-03 DIAGNOSIS — M79675 Pain in left toe(s): Secondary | ICD-10-CM | POA: Diagnosis not present

## 2020-09-03 MED ORDER — CEPHALEXIN 500 MG PO CAPS: 500.0000 mg | ORAL_CAPSULE | Freq: Three times a day (TID) | ORAL | 0 refills | Status: AC

## 2020-09-03 NOTE — Progress Notes (Signed)
Subjective:   Patient ID: Richard Rios, male   DOB: 55 y.o.   MRN: [id number]   HPI 55 year old male presents the office today for concerns of ingrown toenail left big toe, medial aspect is been ongoing for last 1 week.  He states he has any drainage or pus.  He has been cautious about this as he has a hip replacement.  Area is tender.  No recent treatment.  He has no other concerns today.   Review of Systems  All other systems reviewed and are negative.  No past medical history on file.  Past Surgical History:  Procedure Laterality Date   ORCHIECTOMY Right    VASECTOMY       Current Outpatient Medications:    acetaminophen (TYLENOL) 500 MG tablet, Take by mouth., Disp: , Rfl:    Alum Hydroxide-Mag Carbonate 160-105 MG CHEW, CHEW 2 TABLETS BY MOUTH AS NEEDED FOR BREAKTHROUGH SYMPTOMS OF HEARTBURN, INDIGESTION, AND REFLUX (MAXIMUM OF 16 TABLETS DAILY), Disp: , Rfl:    amLODipine (NORVASC) 5 MG tablet, , Disp: , Rfl:    amoxicillin (AMOXIL) 500 MG capsule, Take 4 capsules (2000 mg) one hour prior to dental procedures., Disp: , Rfl:    celecoxib (CELEBREX) 200 MG capsule, , Disp: , Rfl:    cephALEXin (KEFLEX) 500 MG capsule, Take 1 capsule (500 mg total) by mouth 3 (three) times daily., Disp: 21 capsule, Rfl: 0   Cetirizine HCl 10 MG CAPS, Take 1 tablet by mouth daily., Disp: , Rfl:    diazepam (VALIUM) 5 MG tablet, TAKE ONE TABLET BY MOUTH  PRN, Disp: , Rfl:    gabapentin (NEURONTIN) 300 MG capsule, Take 1 capsule by mouth 2 (two) times daily., Disp: , Rfl:    HYDROcodone-acetaminophen (NORCO/VICODIN) 5-325 MG tablet, TAKE ONE TABLET BY MOUTH  PRN, Disp: , Rfl:    hydrocortisone (ANUSOL-HC) 2.5 % rectal cream, INSERT 1 APPLICATION RECTALLY TWICE A DAY, Disp: , Rfl:    hydrocortisone (ANUSOL-HC) 25 MG suppository, INSERT 1 SUPPOSITORY RECTALLY TWICE A DAY, Disp: , Rfl:    hydrocortisone-pramoxine (PROCTOFOAM HC) rectal foam, Place rectally., Disp: , Rfl:    ketorolac (TORADOL) 10 MG  tablet, TAKE ONE TABLET BY MOUTH EVERY 6 HOURS WITH FOOD OR MILK FOR 5 DAYS, Disp: , Rfl:    metFORMIN (GLUCOPHAGE) 500 MG tablet, TAKE ONE TABLET BY MOUTH TWICE A DAY FOR DIABETES (ANNUAL KIDNEY FUNCTION TESTING IS NEEDED) FOR DIABETES, Disp: , Rfl:    methylPREDNISolone (MEDROL DOSEPAK) 4 MG TBPK tablet, TAKE TABLETS BY MOUTH AS DIRECTED ON PACKAGE INSTRUCTIONS, Disp: , Rfl:    PHENTERMINE HCL PO, TAKE 37.5 MG BY MOUTH DAILY AS NEEDED, Disp: , Rfl:    sildenafil (VIAGRA) 100 MG tablet, TAKE ONE TABLET BY MOUTH AS DIRECTED (TAKE 1 HOUR PRIOR TO SEXUAL ACTIVITY *DO NOT EXCEED 1 DOSE PER 24 HOUR PERIOD*), Disp: , Rfl:    sucralfate (CARAFATE) 1 g tablet, Take by mouth., Disp: , Rfl:    tiZANidine (ZANAFLEX) 2 MG tablet, , Disp: , Rfl:    traZODone (DESYREL) 50 MG tablet, TAKE ONE TABLET BY MOUTH AT BEDTIME FOR SLEEP, Disp: , Rfl:    albuterol (PROVENTIL HFA;VENTOLIN HFA) 108 (90 BASE) MCG/ACT inhaler, Inhale 2 puffs into the lungs every 6 (six) hours as needed for wheezing. (Patient not taking: Reported on 11/22/2017), Disp: 2 Inhaler, Rfl: 11   amLODipine (NORVASC) 5 MG tablet, Take 5 mg by mouth daily., Disp: , Rfl:    aspirin EC 81 MG tablet,  Take 81 mg by mouth daily., Disp: , Rfl:    atorvastatin (LIPITOR) 40 MG tablet, Take 1 tablet (40 mg total) by mouth daily., Disp: 90 tablet, Rfl: 3   atorvastatin (LIPITOR) 40 MG tablet, , Disp: , Rfl:    buPROPion (WELLBUTRIN XL) 150 MG 24 hr tablet, 1 tablet daily for a month then 1 tab twice a day (Patient not taking: Reported on 11/22/2017), Disp: 60 tablet, Rfl: 3   celecoxib (CELEBREX) 200 MG capsule, Take 200 mg by mouth 2 (two) times daily., Disp: , Rfl:    cetirizine (ZYRTEC) 10 MG tablet, Take 10 mg by mouth daily., Disp: , Rfl:    dexlansoprazole (DEXILANT) 60 MG capsule, Take 1 capsule (60 mg total) by mouth daily. (Patient not taking: Reported on 02/08/2019), Disp: 30 capsule, Rfl: 11   famotidine (PEPCID) 40 MG tablet, Take 40 mg by mouth  daily., Disp: , Rfl:    fluticasone (FLONASE) 50 MCG/ACT nasal spray, Place into both nostrils daily., Disp: , Rfl:    fluticasone (FLONASE) 50 MCG/ACT nasal spray, Place into the nose., Disp: , Rfl:    fluticasone (FLOVENT DISKUS) 50 MCG/BLIST diskus inhaler, Inhale 1 puff into the lungs 2 (two) times daily., Disp: , Rfl:    lisdexamfetamine (VYVANSE) 30 MG capsule, Take 30 mg by mouth daily., Disp: , Rfl:    metFORMIN (GLUCOPHAGE) 500 MG tablet, Take by mouth 2 (two) times daily with a meal., Disp: , Rfl:    naltrexone (DEPADE) 50 MG tablet, One half tab daily for a month then one half tab twice a day (Patient not taking: Reported on 11/22/2017), Disp: 60 tablet, Rfl: 3   oxyCODONE (OXY IR/ROXICODONE) 5 MG immediate release tablet, Take 5 mg by mouth every 4 (four) hours as needed for severe pain., Disp: , Rfl:    Saw Palmetto 450 MG CAPS, , Disp: , Rfl:    senna (SENOKOT) 8.6 MG tablet, Take 1 tablet by mouth daily., Disp: , Rfl:    Short Ragweed Pollen Ext 12 AMB A 1-U SUBL, 1 tab sublingual daily, first dose to be administered in the office with 30 minutes of observation. (Patient not taking: Reported on 11/22/2017), Disp: 30 tablet, Rfl: 11   triamcinolone cream (KENALOG) 0.5 %, Apply 1 application topically 2 (two) times daily. To affected areas. (Patient not taking: Reported on 11/22/2017), Disp: 30 g, Rfl: 3   valACYclovir (VALTREX) 1000 MG tablet, Take 1 tablet (1,000 mg total) by mouth 2 (two) times daily. (Patient not taking: Reported on 11/22/2017), Disp: 14 tablet, Rfl: 2  Allergies  Allergen Reactions   Kelp-B6-Lecithin-Vinegar     Other reaction(s): Other Cold like symptoms          Objective:  Physical Exam  General: AAO x3, NAD  Dermatological: On the medial aspect the left hallux toenail there is incurvation of the nail mostly the distal aspect there is tenderness palpation the nail border.  Is not able to identify any drainage or pus but see procedure note below.  No  erythema.  No ascending cellulitis.  No open lesions.  Vascular: Dorsalis Pedis artery and Posterior Tibial artery pedal pulses are 2/4 bilateral with immedate capillary fill time. There is no pain with calf compression, swelling, warmth, erythema.   Neruologic: Grossly intact via light touch bilateral.  Musculoskeletal: Tenderness to the ingrown toenail but no other areas of discomfort.  Muscular strength 5/5 in all groups tested bilateral.  Gait: Unassisted, Nonantalgic.       Assessment:  Ingrown toenail left medial hallux     Plan:  -Treatment options discussed including all alternatives, risks, and complications -Etiology of symptoms were discussed -This point discussed with a partial nail avulsion with chemical matricectomy versus nail debridement.  Discussion was proceeded to have the nail corner removed.  Initially discussed with him partial nail avulsion with chemical matricectomy.  Consent was signed after discussing risks and complications.  Skin was prepped with alcohol and 3 cc lidocaine, Marcaine plain was infiltrated in a digital block fashion.  Once anesthetized the skin was prepped with Betadine and tourniquet was applied.  At this time the medial aspect of the left hallux toenail was then sharply excised measuring to the entire offending nail border.  Small amount of purulence distally was noted.  After debridement there is no further purulence I did take a culture of this area today.  Due to this I did not proceed with the phenol.  Irrigated with alcohol.  Hemostasis achieved.  Silvadene was applied followed by dressing.  Tourniquet was released and there is found to be an immediate capillary refill time.  He tolerated the procedure well and complications. -Keflex  Vivi Barrack DPM

## 2020-09-03 NOTE — Progress Notes (Signed)
.  wo

## 2020-09-14 LAB — HOUSE ACCOUNT TRACKING

## 2020-09-23 ENCOUNTER — Telehealth: Payer: Self-pay

## 2020-09-23 NOTE — Telephone Encounter (Signed)
Called patient to check on him after having ingrown removed. No answer, voicemail was left for patient to call the office and give an update on toe status. Culture that was collect 09/03/20 did not result. Per Dr. Ardelle Anton if patient is doing well there is no need to do a repeat culture.
